# Patient Record
Sex: Male | Born: 1980 | Race: White | Hispanic: No | Marital: Married
Health system: Southern US, Community
[De-identification: ages and names within clinical notes are randomized; demographics above are authoritative.]

## PROBLEM LIST (undated history)

## (undated) DIAGNOSIS — Z7289 Other problems related to lifestyle: Secondary | ICD-10-CM

## (undated) DIAGNOSIS — F1090 Alcohol use, unspecified, uncomplicated: Secondary | ICD-10-CM

## (undated) DIAGNOSIS — F32A Depression, unspecified: Secondary | ICD-10-CM

## (undated) DIAGNOSIS — F419 Anxiety disorder, unspecified: Secondary | ICD-10-CM

## (undated) DIAGNOSIS — K219 Gastro-esophageal reflux disease without esophagitis: Secondary | ICD-10-CM

## (undated) DIAGNOSIS — L4050 Arthropathic psoriasis, unspecified: Secondary | ICD-10-CM

## (undated) HISTORY — PX: NO PAST SURGERIES: SHX2092

## (undated) HISTORY — DX: Anxiety disorder, unspecified: F41.9

## (undated) HISTORY — DX: Gastro-esophageal reflux disease without esophagitis: K21.9

## (undated) HISTORY — DX: Arthropathic psoriasis, unspecified: L40.50

## (undated) HISTORY — PX: OTHER SURGICAL HISTORY: SHX169

## (undated) HISTORY — DX: Other problems related to lifestyle: Z72.89

## (undated) HISTORY — DX: Alcohol use, unspecified, uncomplicated: F10.90

---

## 2019-03-30 ENCOUNTER — Other Ambulatory Visit: Payer: Self-pay

## 2019-03-30 ENCOUNTER — Ambulatory Visit
Admission: RE | Admit: 2019-03-30 | Discharge: 2019-03-30 | Disposition: A | Discharge status: Home / Self Care | Payer: No Typology Code available for payment source | Source: Ambulatory Visit | Attending: Internal Medicine | Admitting: Internal Medicine

## 2019-03-30 ENCOUNTER — Other Ambulatory Visit: Payer: Self-pay | Admitting: Internal Medicine

## 2019-03-30 ENCOUNTER — Telehealth: Payer: Self-pay | Admitting: *Deleted

## 2019-03-30 DIAGNOSIS — Z20822 Contact with and (suspected) exposure to covid-19: Secondary | ICD-10-CM

## 2019-03-30 DIAGNOSIS — R112 Nausea with vomiting, unspecified: Secondary | ICD-10-CM

## 2019-03-30 NOTE — Telephone Encounter (Signed)
Dr Johnella Moloney, [facility] called to get the pt scheduled for community COVID testing;  He says that the pt is high risk, and takes, humira for psoriatic arthritis; the pt is also having, n/v, and fever; the pt can be contacted at  [phone]; will attempt to contact pt.

## 2019-03-30 NOTE — Telephone Encounter (Signed)
Contacted pt to schedule testing; he is unable to come if or testing today because he is waiting to have labs drawn, and a chest xray; pt requests to be seen 03/31/2019 at 1000; pt scheduled for that time at the [facility] location; he verbalizes understanding; order placed per protocol.

## 2019-03-31 ENCOUNTER — Other Ambulatory Visit: Payer: Self-pay

## 2019-03-31 ENCOUNTER — Other Ambulatory Visit: Payer: Self-pay | Admitting: Internal Medicine

## 2019-03-31 DIAGNOSIS — R1011 Right upper quadrant pain: Secondary | ICD-10-CM

## 2019-03-31 DIAGNOSIS — Z20822 Contact with and (suspected) exposure to covid-19: Secondary | ICD-10-CM

## 2019-04-03 LAB — NOVEL CORONAVIRUS, NAA: SARS-CoV-2, NAA: NOT DETECTED

## 2019-04-05 ENCOUNTER — Ambulatory Visit
Admission: RE | Admit: 2019-04-05 | Discharge: 2019-04-05 | Disposition: A | Discharge status: Home / Self Care | Payer: No Typology Code available for payment source | Source: Ambulatory Visit | Attending: Internal Medicine | Admitting: Internal Medicine

## 2019-04-05 ENCOUNTER — Other Ambulatory Visit: Payer: Self-pay

## 2019-04-05 DIAGNOSIS — R1011 Right upper quadrant pain: Secondary | ICD-10-CM

## 2019-04-27 NOTE — Progress Notes (Signed)
New Patient Virtual Visit via Video Note The purpose of this virtual visit is to provide medical care while limiting exposure to the novel coronavirus.    Consent was obtained for video visit:  Yes.   Answered questions that patient had about telehealth interaction:  Yes.   I discussed the limitations, risks, security and privacy concerns of performing an evaluation and management service by telemedicine. I also discussed with the patient that there may be a patient responsible charge related to this service. The patient expressed understanding and agreed to proceed.  Pt location: Home Physician Location: office Name of referring provider:  Josetta Huddle, MD I connected with Buren Kos at patients initiation/request on 04/28/2019 at  9:50 AM EDT by video enabled telemedicine application and verified that I am speaking with the correct person using two identifiers. Pt MRN:  [id number] Pt DOB:  [birth year] Video Participants:  Buren Kos    History of Present Illness: Joshua Wade is a 38 y.o. right-handed male with psoriatic arthritis, tobacco use, anxiety, GERD and presenting for evaluation of bilateral leg paresthesias.   Starting in April 2020, he began having nausea, vomiting, and GI upset.  He was tested for COVID which was negative.  Soon after this resolves, he started experiencing pins-and-needles sensation of the toes and soles.  He also has similar sensation in the lower legs below the knee.  Initially, he did experience some imbalance especially with eyes closed, and fatigue.  The symptoms have improved over the past few weeks.  He continues to feel fatigued and has worsening feet paresthesias at the end of a long day of working.  He does not have any low back pain, bowel or bladder incontinence, or weakness of the legs.  He has returned to work as the [employer] at [employer] and has noticed slight burning and tingling of the tips of the fingers.  He was drinking 3 glasses  of wine nightly for the past 5 years, however stopped this prior to his GI upset.  He is on Humira for psoriatic arthritis and is followed by Leafy Kindle, PA.  His father had ALS and was my patient.  Past Medical History:  Diagnosis Date  . Acid reflux   . Alcohol use   . Anxiety   . Psoriatic arthritis [facility])     Past Surgical History:  Procedure Laterality Date  . NO PAST SURGERIES       Medications:  Outpatient Encounter Medications as of 04/28/2019  Medication Sig  . Adalimumab (HUMIRA) 10 MG/0.1ML PSKT Inject into the skin every 14 (fourteen) days.  . ALPRAZolam (XANAX) 0.5 MG tablet Take 0.5 mg by mouth at bedtime as needed for anxiety.  Marland Kitchen escitalopram (LEXAPRO) 10 MG tablet Take 10 mg by mouth daily.  Marland Kitchen LORazepam (ATIVAN) 0.5 MG tablet Take 0.5 mg by mouth 2 (two) times daily.  . NON FORMULARY daily. CBD Oil  . pantoprazole (PROTONIX) 40 MG tablet Take 40 mg by mouth daily.   No facility-administered encounter medications on file as of 04/28/2019.     Allergies:  Allergies  Allergen Reactions  . Penicillins     Family History: Family History  Problem Relation Age of Onset  . Lupus Mother   . Colon cancer Mother   . ALS Father     Social History: Social History   Tobacco Use  . Smoking status: Former Research scientist (life sciences)  . Smokeless tobacco: Never Used  Substance Use Topics  . Alcohol use: Yes  . Drug use:  Not Currently    Types: Marijuana    Comment: in the past   Social History   Social History Narrative   Right handed      Lives with wife in one story home      Owns Restaurant      Highest Level Education- [employer]    Review of Systems:  CONSTITUTIONAL: No fevers, chills, night sweats, or weight loss.   EYES: No visual changes or eye pain ENT: No hearing changes.  No history of nose bleeds.   RESPIRATORY: No cough, wheezing and shortness of breath.   CARDIOVASCULAR: Negative for chest pain, and palpitations.   GI: Negative for  abdominal discomfort, blood in stools or black stools.  No recent change in bowel habits.   GU:  No history of incontinence.   MUSCLOSKELETAL: No history of joint pain or swelling.  No myalgias.   SKIN: Negative for lesions, rash, and itching.   HEMATOLOGY/ONCOLOGY: Negative for prolonged bleeding, bruising easily, and swollen nodes.  No history of cancer.   ENDOCRINE: Negative for cold or heat intolerance, polydipsia or goiter.   PSYCH:  No depression +anxiety symptoms.   NEURO: As Above.   Vital Signs:  Ht 6\' 2"  (1.88 m)   Wt 190 lb (86.2 kg)   BMI 24.39 kg/m    General Medical Exam:  Well appearing, comfortable.  Nonlabored breathing.  No deformity or edema.  No rash.  Neurological Exam: MENTAL STATUS including orientation to time, place, person, recent and remote memory, attention span and concentration, language, and fund of knowledge is normal.  Speech is not dysarthric.  CRANIAL NERVES:  Normal conjugate, extra-ocular eye movements in all directions of gaze.  No ptosis.  Normal facial symmetry and movements.  Normal shoulder shrug and head rotation.  Tongue is midline.  MOTOR:  Antigravity in all extremities.  No abnormal movements.  No pronator drift.   SENSORY & REFLEXES: Unable to assess   COORDINATION/GAIT: Normal finger to nose bilaterally.  Intact rapid alternating movements bilaterally.  Able to rise from a chair without using arms.  Gait narrow based and stable. Tandem and stressed gait intact.    IMPRESSION/PLAN: Subacute onset of bilateral feet and leg paresthesias.  Symptoms could certainly be related to alcohol use and/or associated vitamin deficiencies.  Alternatively, with his preceding GI illness, demyelinating polyradiculoneuropathy is also considered.  I will request his recent lab test from his PCPs office, to check to see whether vitamin B-12, vitamin B1, folate, and TSH has been checked.   I will plan to bring him to the office for a formal neuromuscular  exam and determine whether electrodiagnostic testing is indicated. For paresthesias, start gabapentin 300 mg at bedtime for 1 week then increase to 300 mg twice daily.  Follow Up Instructions:  I discussed the assessment and treatment plan with the patient. The patient was provided an opportunity to ask questions and all were answered. The patient agreed with the plan and demonstrated an understanding of the instructions.   The patient was advised to call back or seek an in-person evaluation if the symptoms worsen or if the condition fails to improve as anticipated.  Return to clinic 1-2 weeks  Total Time spent:  50 min   [place] Chardonika K Patel, DO

## 2019-04-28 ENCOUNTER — Telehealth: Payer: Self-pay

## 2019-04-28 ENCOUNTER — Other Ambulatory Visit: Payer: Self-pay

## 2019-04-28 ENCOUNTER — Encounter: Payer: Self-pay | Admitting: Neurology

## 2019-04-28 ENCOUNTER — Telehealth (INDEPENDENT_AMBULATORY_CARE_PROVIDER_SITE_OTHER): Payer: No Typology Code available for payment source | Admitting: Neurology

## 2019-04-28 VITALS — Ht 74.0 in | Wt 190.0 lb

## 2019-04-28 DIAGNOSIS — R202 Paresthesia of skin: Secondary | ICD-10-CM

## 2019-04-28 MED ORDER — GABAPENTIN 300 MG PO CAPS: ORAL_CAPSULE | ORAL | 3 refills | Status: DC

## 2019-04-28 NOTE — Telephone Encounter (Signed)
Requested records from [place]. Dr. Josetta Huddle from the last 2 months.

## 2019-05-01 ENCOUNTER — Other Ambulatory Visit: Payer: Self-pay

## 2019-05-01 DIAGNOSIS — R202 Paresthesia of skin: Secondary | ICD-10-CM

## 2019-05-01 NOTE — Telephone Encounter (Signed)
PCP notes and labs received dated 04/13/2019.  Labs indicate that he was consuming 5 to 6 glasses of wine a day and found to be very tremulous on exam.  Labs indicated transaminitis with AST and ALT in the 100-200 range.  He was started on Lorazepam for possible alcohol withdrawal, which had helped.  Labs 03/30/2019: AST 156, ALT 247, total bilirubin 1.4, lipase 174, vitamin B12 403, TSH 1.87  Labs 04/13/2019: AST 22, ALT 24, lipase 82, total bilirubin 0.3  I will plan to check a few additional labs and bring him to the office this week for formal neuromuscular exam and EMG, if needed.  Donika K. Posey Pronto, DO

## 2019-05-01 NOTE — Telephone Encounter (Signed)
Patient is coming in for a follow up at 2:30 on 05-04-19 and a EMG on 05-04-19 @ 3:00. Patient was also informed that he would need more blood work.   I need to know which body part we are doing the EMG on and Caryl Pina will you please put the order in so I can link it a

## 2019-05-01 NOTE — Telephone Encounter (Signed)
EMG order placed. Bilateral lower extremities

## 2019-05-04 ENCOUNTER — Other Ambulatory Visit: Payer: Self-pay

## 2019-05-04 ENCOUNTER — Ambulatory Visit (INDEPENDENT_AMBULATORY_CARE_PROVIDER_SITE_OTHER): Payer: No Typology Code available for payment source | Admitting: Neurology

## 2019-05-04 ENCOUNTER — Encounter: Payer: Self-pay | Admitting: Neurology

## 2019-05-04 VITALS — BP 122/88 | HR 112 | Ht 74.5 in | Wt 192.0 lb

## 2019-05-04 DIAGNOSIS — R202 Paresthesia of skin: Secondary | ICD-10-CM | POA: Diagnosis not present

## 2019-05-04 DIAGNOSIS — G629 Polyneuropathy, unspecified: Secondary | ICD-10-CM

## 2019-05-04 NOTE — Procedures (Signed)
[facility]  [place], [place]  [place], [place] [address] Tel: [phone] Fax:  [phone] Test Date:  05/04/2019  Patient: Joshua Wade DOB: 1981-10-12 Physician: Narda Amber, DO  Sex: Male Height: 6\' 2"  Ref Phys: Narda Amber, DO  ID#: [id number] Temp: 33.0C Technician:    Patient Complaints: This is a 38 year old man referred for evaluation of subacute onset of bilateral feet paresthesias and pain.  NCV & EMG Findings: Extensive electrodiagnostic testing of the right lower extremity and additional studies of the left shows:  1. Bilateral sural sensory responses show mildly reduced amplitude (R4.8, L4.8 V).  Bilateral superficial peroneal sensory responses are borderline normal. 2. Right peroneal motor response is reduced at the extensor digitorum brevis, and normal at the tibialis anterior.  Left peroneal and bilateral tibial motor responses are within normal limits. 3. Bilateral tibial H reflex studies show prolonged latencies. 4. There is no evidence of active or chronic motor axonal loss changes affecting any of the tested muscles.  Motor unit configuration and recruitment pattern is within normal limits.  There is no evidence of active denervation in the lumbar paraspinal muscles.  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  Overall, these findings are mild in degree electrically.   ___________________________ Narda Amber, DO    Nerve Conduction Studies Anti Sensory Summary Table   Site NR Peak (ms) Norm Peak (ms) P-T Amp (V) Norm P-T Amp  Left Sup Peroneal Anti Sensory (Ant Lat Mall)  33C  12 cm    3.2 <4.5 5.5 >5  Right Sup Peroneal Anti Sensory (Ant Lat Mall)  33C  12 cm    2.8 <4.5 5.1 >5  Left Sural Anti Sensory (Lat Mall)  33C  Calf    4.5 <4.5 4.8 >5  Right Sural Anti Sensory (Lat Mall)  33C  Calf    3.5 <4.5 4.8  >5   Motor Summary Table   Site NR Onset (ms) Norm Onset (ms) O-P Amp (mV) Norm O-P Amp Site1 Site2 Delta-0 (ms) Dist (cm) Vel (m/s) Norm Vel (m/s)  Left Peroneal Motor (Ext Dig Brev)  33C  Ankle    4.6 <5.5 4.0 >3 B Fib Ankle 9.3 40.0 43 >40  B Fib    13.9  3.4  Poplt B Fib 2.2 9.0 41 >40  Poplt    16.1  3.4         Right Peroneal Motor (Ext Dig Brev)  33C  Ankle    4.8 <5.5 2.6 >3 B Fib Ankle 10.1 40.0 40 >40  B Fib    14.9  2.3  Poplt B Fib 2.3 10.0 43 >40  Poplt    17.2  2.2         Left Peroneal TA Motor (Tib Ant)  33C  Fib Head    3.9 <4.0 4.0 >4 Poplit Fib Head 2.2 9.0 41 >40  Poplit    6.1  4.0         Right Peroneal TA Motor (Tib Ant)  33C  Fib Head    3.1 <4.0 4.5 >4 Poplit Fib Head 2.1 9.0 43 >40  Poplit    5.2  4.5         Left Tibial Motor (Abd Hall Brev)  33C  Ankle    4.5 <6.0 11.8 >8 Knee Ankle 11.3 45.0 40 >40  Knee    15.8  9.8  Right Tibial Motor (Abd Hall Brev)  33C  Ankle    5.7 <6.0 11.8 >8 Knee Ankle 10.6 45.0 42 >40  Knee    16.3  9.1          H Reflex Studies   NR H-Lat (ms) Lat Norm (ms) L-R H-Lat (ms)  Left Tibial (Gastroc)  33C     40.41 <35 0.41  Right Tibial (Gastroc)  33C     40.00 <35 0.41   EMG   Side Muscle Ins Act Fibs Psw Fasc Number Recrt Dur Dur. Amp Amp. Poly Poly. Comment  Left Flex Dig Long Nml Nml Nml Nml Nml Nml Nml Nml Nml Nml Nml Nml N/A  Left AntTibialis Nml Nml Nml Nml Nml Nml Nml Nml Nml Nml Nml Nml N/A  Left Gastroc Nml Nml Nml Nml Nml Nml Nml Nml Nml Nml Nml Nml N/A  Left BicepsFemS Nml Nml Nml Nml Nml Nml Nml Nml Nml Nml Nml Nml N/A  Left GluteusMed Nml Nml Nml Nml Nml Nml Nml Nml Nml Nml Nml Nml N/A  Left RectFemoris Nml Nml Nml Nml Nml Nml Nml Nml Nml Nml Nml Nml N/A  Right AntTibialis Nml Nml Nml Nml Nml Nml Nml Nml Nml Nml Nml Nml N/A  Right GluteusMed Nml Nml Nml Nml Nml Nml Nml Nml Nml Nml Nml Nml N/A  Right Flex Dig Long Nml Nml Nml Nml Nml Nml Nml Nml Nml Nml Nml Nml N/A  Right RectFemoris Nml Nml  Nml Nml Nml Nml Nml Nml Nml Nml Nml Nml N/A  Right Gastroc Nml Nml Nml Nml Nml Nml Nml Nml Nml Nml Nml Nml N/A  Right BicepsFemS Nml Nml Nml Nml Nml Nml Nml Nml Nml Nml Nml Nml N/A  Right Lumbo Parasp Low Nml Nml Nml Nml NE - - - - - - - N/A      Waveforms:

## 2019-05-05 DIAGNOSIS — G629 Polyneuropathy, unspecified: Secondary | ICD-10-CM | POA: Insufficient documentation

## 2019-05-05 NOTE — Progress Notes (Signed)
Follow-up Visit   Date: 05/04/2019   Joshua Wade MRN: [id number] DOB: 12-Aug-1981   Interim History: Joshua Wade is a 38 y.o. right-handed Caucasian male with returning to the clinic for follow-up of bilateral feet paresthesias.  The patient was accompanied to the clinic by self.  History of present illness: Starting in April 2020, he began having nausea, vomiting, and GI upset.  He was tested for COVID which was negative.  Soon after this resolves, he started experiencing pins-and-needles sensation of the toes and soles.  He also has similar sensation in the lower legs below the knee.  Initially, he did experience some imbalance especially with eyes closed, and fatigue.  The symptoms have improved over the past few weeks.  He continues to feel fatigued and has worsening feet paresthesias at the end of a long day of working.  He does not have any low back pain, bowel or bladder incontinence, or weakness of the legs.  He has returned to work as the [employer] at [employer] and has noticed slight burning and tingling of the tips of the fingers.  He was drinking 3 glasses of wine nightly for the past 5 years, however stopped this prior to his GI upset.  UPDATE 05/04/2019: He is here for follow-up visit and electrodiagnostic testing of the legs.  At his last visit, he was started on gabapentin 300 mg at bedtime, which has helped reduce the burning and tingling sensation of his feet.  He continues to have low-grade tingling and pain in the feet at all times, especially after a long day at work.  His overall strength and balance has significantly improved.  He has been abstaining from alcohol.  He no longer has numbness or tingling of the fingertips.  Medications:  Current Outpatient Medications on File Prior to Visit  Medication Sig Dispense Refill  . Adalimumab (HUMIRA) 10 MG/0.1ML PSKT Inject into the skin every 14 (fourteen) days.    . ALPRAZolam (XANAX) 0.5 MG tablet Take 0.5 mg by  mouth at bedtime as needed for anxiety.    Marland Kitchen escitalopram (LEXAPRO) 10 MG tablet Take 10 mg by mouth daily.    Marland Kitchen gabapentin (NEURONTIN) 300 MG capsule Take 1 tablet at bedtime for one week, then increase to 1 tablet twice daily. 60 capsule 3  . LORazepam (ATIVAN) 0.5 MG tablet Take 0.5 mg by mouth 2 (two) times daily.    . NON FORMULARY daily. CBD Oil    . pantoprazole (PROTONIX) 40 MG tablet Take 40 mg by mouth 3 times/day as needed-between meals & bedtime.      No current facility-administered medications on file prior to visit.     Allergies:  Allergies  Allergen Reactions  . Penicillins     Review of Systems:  CONSTITUTIONAL: No fevers, chills, night sweats, or weight loss.  EYES: No visual changes or eye pain ENT: No hearing changes.  No history of nose bleeds.   RESPIRATORY: No cough, wheezing and shortness of breath.   CARDIOVASCULAR: Negative for chest pain, and palpitations.   GI: Negative for abdominal discomfort, blood in stools or black stools.  No recent change in bowel habits.   GU:  No history of incontinence.   MUSCLOSKELETAL: No history of joint pain or swelling.  No myalgias.   SKIN: Negative for lesions, rash, and itching.   ENDOCRINE: Negative for cold or heat intolerance, polydipsia or goiter.   PSYCH:  No depression or anxiety symptoms.   NEURO: As Above.  Vital Signs:  BP 122/88   Pulse (!) 112   Ht 6' 2.5" (1.892 m)   Wt 192 lb (87.1 kg)   SpO2 98%   BMI 24.32 kg/m    General Medical Exam:   General:  Well appearing, comfortable  Eyes/ENT: see cranial nerve examination.   Neck:  No carotid bruits. Respiratory:  Clear to auscultation, good air entry bilaterally.   Cardiac:  Regular rate and rhythm, no murmur.   Ext:  No edema   Neurological Exam: MENTAL STATUS including orientation to time, place, person, recent and remote memory, attention span and concentration, language, and fund of knowledge is normal.  Speech is not dysarthric.  CRANIAL  NERVES:  No visual field defects.  Pupils equal round and reactive to light.  Normal conjugate, extra-ocular eye movements in all directions of gaze.  No ptosis.  Face is symmetric.   MOTOR:  Motor strength is 5/5 in all extremities, trace weakness with toe extension bilaterally.  No atrophy, fasciculations or abnormal movements.  No pronator drift.  Tone is normal.    MSRs:  Reflexes are 2+/4 throughout, except trace bilateral Achilles.  Plantars are downgoing.  SENSORY:  Reduced vibration at the ankles, hyperesthesia to pin prick and temperature below the ankles and slightly into the lower leg.  Proprioception is intact.   COORDINATION/GAIT:  Normal finger-to- nose-finger.  Intact rapid alternating movements bilaterally.  Gait narrow based and stable.  Stressed and tandem gait intact.   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  Overall, these findings are mild in degree electrically.  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  His neurological exam also is consistent with a distal neuropathy, as noted by reduced reflexes, trace weakness in the toes, and sensory deficits in the feet.  No findings on EMG to support CIDP, however the subacute onset of his neuropathy still raises this possibility and if there is any worsening or lack of improvement going forward, I have low threshold to obtain CSF analysis.   I will check a few additional labs for this treatable causes of neuropathy including ESR, CRP, MMA, vitamin B1, folate, SPEP with IFE, copper.   For pain, increase gabapentin to 310m twice daily - this can be further titrated, as  needed  If this is all alcohol-related, symptoms should improve with time, especially as he continues to minimize alcohol consumption.  Further recommendations pending results.   Thank you for allowing me to participate in patient's care.  If I can answer any additional questions, I would be pleased to do so.    Sincerely,    Lynard Postlewait K. PPosey Pronto DO

## 2019-05-08 ENCOUNTER — Other Ambulatory Visit: Payer: Self-pay | Admitting: Neurology

## 2019-05-12 LAB — IMMUNOFIXATION ELECTROPHORESIS
IgG (Immunoglobin G), Serum: 847 mg/dL (ref 600–1640)
IgM, Serum: 60 mg/dL (ref 50–300)
Immunofix Electr Int: NOT DETECTED
Immunoglobulin A: 203 mg/dL (ref 47–310)

## 2019-05-12 LAB — PROTEIN ELECTROPHORESIS, SERUM
Albumin ELP: 4.4 g/dL (ref 3.8–4.8)
Alpha 1: 0.3 g/dL (ref 0.2–0.3)
Alpha 2: 0.7 g/dL (ref 0.5–0.9)
Beta 2: 0.3 g/dL (ref 0.2–0.5)
Beta Globulin: 0.4 g/dL (ref 0.4–0.6)
Gamma Globulin: 0.8 g/dL (ref 0.8–1.7)
Total Protein: 7 g/dL (ref 6.1–8.1)

## 2019-05-12 LAB — VITAMIN B1: Vitamin B1 (Thiamine): 10 nmol/L (ref 8–30)

## 2019-05-12 LAB — C-REACTIVE PROTEIN: CRP: 0.2 mg/L (ref <8.0)

## 2019-05-12 LAB — SEDIMENTATION RATE: Sed Rate: 2 mm/h (ref 0–15)

## 2019-05-12 LAB — FOLATE: Folate: 17.2 ng/mL

## 2019-05-12 LAB — METHYLMALONIC ACID, SERUM: Methylmalonic Acid, Quant: 108 nmol/L (ref 87–318)

## 2019-05-12 LAB — COPPER, SERUM: Copper: 98 ug/dL (ref 70–175)

## 2019-06-30 ENCOUNTER — Other Ambulatory Visit: Payer: Self-pay | Admitting: Neurology

## 2019-06-30 NOTE — Telephone Encounter (Signed)
Requested Prescriptions   Pending Prescriptions Disp Refills  . gabapentin (NEURONTIN) 300 MG capsule [Pharmacy Med Name: GABAPENTIN 300MG  CAPSULES] 60 capsule 3    Sig: TAKE 1 CAPSULE BY MOUTH AT BEDTIME FOR 1 WEEK. THEN INCREASE TO 1 CAPSULE 2 TIMES DAILY   Rx last filled: 04/28/19 #60 3 refills  Pt last seen:05/04/19  For pain, increase gabapentin to 300mg  twice daily - this can be further titrated, as needed  Follow up appt scheduled:07/12/19

## 2019-07-06 ENCOUNTER — Encounter: Payer: Self-pay | Admitting: Neurology

## 2019-07-12 ENCOUNTER — Encounter: Payer: Self-pay | Admitting: Neurology

## 2019-07-12 ENCOUNTER — Other Ambulatory Visit: Payer: Self-pay

## 2019-07-12 ENCOUNTER — Ambulatory Visit (INDEPENDENT_AMBULATORY_CARE_PROVIDER_SITE_OTHER): Payer: No Typology Code available for payment source | Admitting: Neurology

## 2019-07-12 VITALS — BP 110/80 | HR 107 | Ht 74.0 in | Wt 198.0 lb

## 2019-07-12 DIAGNOSIS — G629 Polyneuropathy, unspecified: Secondary | ICD-10-CM | POA: Diagnosis not present

## 2019-07-12 MED ORDER — GABAPENTIN 300 MG PO CAPS: 300.0000 mg | ORAL_CAPSULE | Freq: Two times a day (BID) | ORAL | 3 refills | Status: DC

## 2019-07-12 NOTE — Progress Notes (Signed)
Follow-up Visit   Date: 05/04/2019   Joshua Wade MRN: [id number] DOB: 1980/11/28   Interim History: Joshua Wade is a 38 y.o. right-handed Caucasian male with returning to the clinic for follow-up of bilateral feet paresthesias.  The patient was accompanied to the clinic by self.  History of present illness: Starting in April 2020, he began having nausea, vomiting, and GI upset.  He was tested for COVID which was negative.  Soon after this resolves, he started experiencing pins-and-needles sensation of the toes and soles.  He also has similar sensation in the lower legs below the knee.  Initially, he did experience some imbalance especially with eyes closed, and fatigue.  The symptoms have improved over the past few weeks.  He continues to feel fatigued and has worsening feet paresthesias at the end of a long day of working.  He does not have any low back pain, bowel or bladder incontinence, or weakness of the legs.  He has returned to work as the [employer] at [employer] and has noticed slight burning and tingling of the tips of the fingers.  He was drinking 3 glasses of wine nightly for the past 5 years, however stopped this prior to his GI upset.  UPDATE 05/04/2019: He is here for follow-up visit and electrodiagnostic testing of the legs.  At his last visit, he was started on gabapentin 300 mg at bedtime, which has helped reduce the burning and tingling sensation of his feet.  He continues to have low-grade tingling and pain in the feet at all times, especially after a long day at work.  His overall strength and balance has significantly improved.  He has been abstaining from alcohol.  He no longer has numbness or tingling of the fingertips.  UPDATE 07/12/2019:  He is here for follow-up visit and reports that he has less pain in the legs, which is now only apparent at the end of the day and with toe extension, such as when doing a push-up.  Pain no longer prevents him from falling  asleep.  He continues to have constant numbness in the feet, however this is no worse than before.  Overall, he feels his symptoms are slowly starting to improve.  His balance is better.  His endurance is still not where he would like for it to be and he tends to tire easily.  He does admit to drinking a couple of glasses of wine nightly, however not as much as he previously was.   Medications:  Current Outpatient Medications on File Prior to Visit  Medication Sig Dispense Refill  . Adalimumab (HUMIRA) 10 MG/0.1ML PSKT Inject into the skin every 14 (fourteen) days.    . ALPRAZolam (XANAX) 0.5 MG tablet Take 0.5 mg by mouth at bedtime as needed for anxiety.    Marland Kitchen escitalopram (LEXAPRO) 10 MG tablet Take 10 mg by mouth daily.    Marland Kitchen LORazepam (ATIVAN) 0.5 MG tablet Take 0.5 mg by mouth 2 (two) times daily.    . NON FORMULARY daily. CBD Oil    . pantoprazole (PROTONIX) 40 MG tablet Take 40 mg by mouth 3 times/day as needed-between meals & bedtime.      No current facility-administered medications on file prior to visit.     Allergies:  Allergies  Allergen Reactions  . Penicillins     Review of Systems:  CONSTITUTIONAL: No fevers, chills, night sweats, or weight loss.  EYES: No visual changes or eye pain ENT: No hearing changes.  No history  of nose bleeds.   RESPIRATORY: No cough, wheezing and shortness of breath.   CARDIOVASCULAR: Negative for chest pain, and palpitations.   GI: Negative for abdominal discomfort, blood in stools or black stools.  No recent change in bowel habits.   GU:  No history of incontinence.   MUSCLOSKELETAL: No history of joint pain or swelling.  No myalgias.   SKIN: Negative for lesions, rash, and itching.   ENDOCRINE: Negative for cold or heat intolerance, polydipsia or goiter.   PSYCH:  No depression or anxiety symptoms.   NEURO: As Above.   Vital Signs:  BP 110/80   Pulse (!) 107   Ht 6' 2"  (1.88 m)   Wt 198 lb (89.8 kg)   SpO2 98%   BMI 25.42 kg/m    Neurological Exam: MENTAL STATUS including orientation to time, place, person, recent and remote memory, attention span and concentration, language, and fund of knowledge is normal.  Speech is not dysarthric.  CRANIAL NERVES:    Face is symmetric.   MOTOR:  Motor strength is 5/5 in all extremities, including distally with toe flexion and extension  (improved) hello no atrophy, fasciculations or abnormal movements.  No pronator drift.  Tone is normal.    MSRs:                                           Right        Left brachioradialis 2+  2+  biceps 2+  2+  triceps 2+  2+  patellar 2+  2+  ankle jerk 1+  1+  Hoffman no  no  plantar response down  down    SENSORY:  Reduced vibration at the ankles, hyperesthesia to pin prick and temperature below the ankles; sensation in the lower leg is intact.  Proprioception is intact.  Rhomberg testing is negative.  COORDINATION/GAIT:  Normal finger-to- nose-finger.  Intact rapid alternating movements bilaterally.  Gait narrow based and stable.  Stressed and tandem gait intact.   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  Overall, these findings are mild in degree electrically.  Labs 03/30/2019: AST 156, ALT 247, total bilirubin 1.4, lipase 174, vitamin B12 403, TSH 1.87  Labs 04/13/2019: AST 22, ALT 24, lipase 82, total bilirubin 0.3  Labs 05/08/2019:  ESR 2, CRP 0.2, MMA 108, vitamin B1 10, folate 17.2, SPEP with IFE no M protein, copper 98.    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  This is supported by his EDX findings and exam.  Fortunately, he is seeing slow improvement.  I have encouraged him to limit alcohol to no more than one glass of wine daily, if not less. Continue gabapentin 367m twice daily which adequately controls  pain  Return to clinic in 6 months  Thank you for allowing me to participate in patient's care.  If I can answer any additional questions, I would be pleased to do so.    Sincerely,    Jeryn Cerney K. PPosey Pronto DO

## 2019-07-12 NOTE — Patient Instructions (Addendum)
It was great to see you today!  Continue gabapentin 300mg  twice daily  Try to limit alcohol   Return to clinic in 6 months

## 2020-01-09 ENCOUNTER — Encounter: Payer: Self-pay | Admitting: Neurology

## 2020-01-12 ENCOUNTER — Telehealth (INDEPENDENT_AMBULATORY_CARE_PROVIDER_SITE_OTHER): Payer: No Typology Code available for payment source | Admitting: Neurology

## 2020-01-12 ENCOUNTER — Other Ambulatory Visit: Payer: Self-pay

## 2020-01-12 DIAGNOSIS — G629 Polyneuropathy, unspecified: Secondary | ICD-10-CM

## 2020-01-12 NOTE — Progress Notes (Signed)
   Virtual Visit via Video Note The purpose of this virtual visit is to provide medical care while limiting exposure to the novel coronavirus.    Consent was obtained for video visit:  Yes.   Answered questions that patient had about telehealth interaction:  Yes.   I discussed the limitations, risks, security and privacy concerns of performing an evaluation and management service by telemedicine. I also discussed with the patient that there may be a patient responsible charge related to this service. The patient expressed understanding and agreed to proceed.  Pt location: Home Physician Location: office Name of referring provider:  Marden Noble, MD I connected with Joshua Wade at patients initiation/request on 01/12/2020 at 10:30 AM EST by video enabled telemedicine application and verified that I am speaking with the correct person using two identifiers. Pt MRN:  [id number] Pt DOB:  01-25-1981 Video Participants:  Joshua Wade   History of Present Illness: This is a 39 y.o. male returning for follow-up of peripheral neuropathy.  He has noticed that the numbness no longer involves his ankles or upper legs.  He has tingling burning over the tops of the feet which is well-controlled on gabapentin 300-600mg /d.  He was taking gabapentin 300mg  twice daily, but tends to only take it in the morning before work and often skips the evening dose.  Despite this, he has not noticed worsening paresthesias. He denies any new weakness, imbalance, or falls.  He is trying to be more active and walking 3-4 miles daily and stays busy at work.  He is drinking 2 glasses of wine nightly.    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  Overall, symptoms are slowly improving and he has less numbness and is able to taper gabapentin to 300mg  daily without worsening symptoms. He may continue to take gabapentin 300mg  daily + extra dose as needed for severe  pain Limit alcohol to 2 glasses nightly Continue multivitamin  Return to clinic in 8 months  Follow Up Instructions:   I discussed the assessment and treatment plan with the patient. The patient was provided an opportunity to ask questions and all were answered. The patient agreed with the plan and demonstrated an understanding of the instructions.   The patient was advised to call back or seek an in-person evaluation if the symptoms worsen or if the condition fails to improve as anticipated.   , DO

## 2020-01-22 ENCOUNTER — Ambulatory Visit: Payer: Self-pay | Attending: Internal Medicine

## 2020-01-22 DIAGNOSIS — Z23 Encounter for immunization: Secondary | ICD-10-CM | POA: Insufficient documentation

## 2020-01-22 NOTE — Progress Notes (Signed)
   Covid-19 Vaccination Clinic  Name:  Santhosh Gulino    MRN: [id number] DOB: October 15, 1981  01/22/2020  Mr. Mollenkopf was observed post Covid-19 immunization for 15 minutes without incident. He was provided with Vaccine Information Sheet and instruction to access the V-Safe system.   Mr. Spradley was instructed to call 911 with any severe reactions post vaccine: Marland Kitchen Difficulty breathing  . Swelling of face and throat  . A fast heartbeat  . A bad rash all over body  . Dizziness and weakness   Immunizations Administered    Name Date Dose VIS Date Route   Pfizer COVID-19 Vaccine 01/22/2020  4:58 PM 0.3 mL 10/27/2019 Intramuscular   Manufacturer: [employer], [employer]   Lot: [id number]   NDC: [address]-1

## 2020-02-12 ENCOUNTER — Ambulatory Visit: Payer: Self-pay | Attending: Internal Medicine

## 2020-02-12 DIAGNOSIS — Z23 Encounter for immunization: Secondary | ICD-10-CM

## 2020-02-12 NOTE — Progress Notes (Signed)
   Covid-19 Vaccination Clinic  Name:  Joshua Wade    MRN: [id number] DOB: 1981/09/30  02/12/2020  Joshua Wade was observed post Covid-19 immunization for 15 minutes without incident. He was provided with Vaccine Information Sheet and instruction to access the V-Safe system.   Joshua Wade was instructed to call 911 with any severe reactions post vaccine: Marland Kitchen Difficulty breathing  . Swelling of face and throat  . A fast heartbeat  . A bad rash all over body  . Dizziness and weakness   Immunizations Administered    Name Date Dose VIS Date Route   Pfizer COVID-19 Vaccine 02/12/2020 11:20 AM 0.3 mL 10/27/2019 Intramuscular   Manufacturer: [employer], [employer]   Lot: [id number]   NDC: [address]-8

## 2020-03-18 ENCOUNTER — Other Ambulatory Visit: Payer: Self-pay

## 2020-03-18 MED ORDER — GABAPENTIN 300 MG PO CAPS: 300.0000 mg | ORAL_CAPSULE | Freq: Two times a day (BID) | ORAL | 3 refills | Status: DC

## 2020-05-24 ENCOUNTER — Emergency Department (HOSPITAL_COMMUNITY): Payer: Commercial Managed Care - PPO

## 2020-05-24 ENCOUNTER — Encounter (HOSPITAL_COMMUNITY): Payer: Self-pay

## 2020-05-24 ENCOUNTER — Observation Stay (HOSPITAL_BASED_OUTPATIENT_CLINIC_OR_DEPARTMENT_OTHER)
Admission: EM | Admit: 2020-05-24 | Discharge: 2020-05-26 | Disposition: A | Discharge status: Home / Self Care | Payer: Commercial Managed Care - PPO | Source: Home / Self Care | Attending: Emergency Medicine | Admitting: Emergency Medicine

## 2020-05-24 DIAGNOSIS — Z5321 Procedure and treatment not carried out due to patient leaving prior to being seen by health care provider: Secondary | ICD-10-CM | POA: Insufficient documentation

## 2020-05-24 DIAGNOSIS — T1490XA Injury, unspecified, initial encounter: Secondary | ICD-10-CM

## 2020-05-24 DIAGNOSIS — S0993XA Unspecified injury of face, initial encounter: Secondary | ICD-10-CM

## 2020-05-24 DIAGNOSIS — D696 Thrombocytopenia, unspecified: Secondary | ICD-10-CM

## 2020-05-24 DIAGNOSIS — R569 Unspecified convulsions: Secondary | ICD-10-CM

## 2020-05-24 DIAGNOSIS — W19XXXA Unspecified fall, initial encounter: Secondary | ICD-10-CM

## 2020-05-24 DIAGNOSIS — R7989 Other specified abnormal findings of blood chemistry: Secondary | ICD-10-CM

## 2020-05-24 DIAGNOSIS — Y9301 Activity, walking, marching and hiking: Secondary | ICD-10-CM | POA: Insufficient documentation

## 2020-05-24 DIAGNOSIS — F10239 Alcohol dependence with withdrawal, unspecified: Secondary | ICD-10-CM | POA: Diagnosis not present

## 2020-05-24 DIAGNOSIS — K759 Inflammatory liver disease, unspecified: Secondary | ICD-10-CM

## 2020-05-24 DIAGNOSIS — W010XXA Fall on same level from slipping, tripping and stumbling without subsequent striking against object, initial encounter: Secondary | ICD-10-CM | POA: Insufficient documentation

## 2020-05-24 DIAGNOSIS — Z20822 Contact with and (suspected) exposure to covid-19: Secondary | ICD-10-CM | POA: Insufficient documentation

## 2020-05-24 DIAGNOSIS — F1010 Alcohol abuse, uncomplicated: Secondary | ICD-10-CM | POA: Diagnosis present

## 2020-05-24 DIAGNOSIS — F410 Panic disorder [episodic paroxysmal anxiety] without agoraphobia: Secondary | ICD-10-CM | POA: Insufficient documentation

## 2020-05-24 DIAGNOSIS — Z79899 Other long term (current) drug therapy: Secondary | ICD-10-CM | POA: Insufficient documentation

## 2020-05-24 DIAGNOSIS — S01512A Laceration without foreign body of oral cavity, initial encounter: Secondary | ICD-10-CM | POA: Insufficient documentation

## 2020-05-24 DIAGNOSIS — S0990XA Unspecified injury of head, initial encounter: Secondary | ICD-10-CM

## 2020-05-24 DIAGNOSIS — Y990 Civilian activity done for income or pay: Secondary | ICD-10-CM | POA: Insufficient documentation

## 2020-05-24 DIAGNOSIS — F10230 Alcohol dependence with withdrawal, uncomplicated: Secondary | ICD-10-CM | POA: Insufficient documentation

## 2020-05-24 DIAGNOSIS — F10231 Alcohol dependence with withdrawal delirium: Secondary | ICD-10-CM | POA: Diagnosis not present

## 2020-05-24 DIAGNOSIS — Y92511 Restaurant or cafe as the place of occurrence of the external cause: Secondary | ICD-10-CM | POA: Insufficient documentation

## 2020-05-24 HISTORY — DX: Depression, unspecified: F32.A

## 2020-05-24 LAB — CBC
HCT: 43.4 % (ref 39.0–52.0)
Hemoglobin: 13.9 g/dL (ref 13.0–17.0)
MCH: 31.4 pg (ref 26.0–34.0)
MCHC: 32 g/dL (ref 30.0–36.0)
MCV: 98.2 fL (ref 80.0–100.0)
Platelets: 60 10*3/uL — ABNORMAL LOW (ref 150–400)
RBC: 4.42 MIL/uL (ref 4.22–5.81)
RDW: 13.9 % (ref 11.5–15.5)
WBC: 6 10*3/uL (ref 4.0–10.5)
nRBC: 0 % (ref 0.0–0.2)

## 2020-05-24 LAB — URINALYSIS, ROUTINE W REFLEX MICROSCOPIC
Bacteria, UA: NONE SEEN
Bilirubin Urine: NEGATIVE
Glucose, UA: NEGATIVE mg/dL
Ketones, ur: 20 mg/dL — AB
Leukocytes,Ua: NEGATIVE
Nitrite: NEGATIVE
Protein, ur: >=300 mg/dL — AB
Specific Gravity, Urine: 1.019 (ref 1.005–1.030)
pH: 6 (ref 5.0–8.0)

## 2020-05-24 LAB — COMPREHENSIVE METABOLIC PANEL
ALT: 145 U/L — ABNORMAL HIGH (ref 0–44)
AST: 211 U/L — ABNORMAL HIGH (ref 15–41)
Albumin: 4.4 g/dL (ref 3.5–5.0)
Alkaline Phosphatase: 65 U/L (ref 38–126)
Anion gap: 29 — ABNORMAL HIGH (ref 5–15)
BUN: 8 mg/dL (ref 6–20)
CO2: 11 mmol/L — ABNORMAL LOW (ref 22–32)
Calcium: 9.3 mg/dL (ref 8.9–10.3)
Chloride: 95 mmol/L — ABNORMAL LOW (ref 98–111)
Creatinine, Ser: 0.98 mg/dL (ref 0.61–1.24)
GFR calc Af Amer: >60 mL/min (ref >60)
GFR calc non Af Amer: >60 mL/min (ref >60)
Glucose, Bld: 159 mg/dL — ABNORMAL HIGH (ref 70–99)
Potassium: 4.1 mmol/L (ref 3.5–5.1)
Sodium: 135 mmol/L (ref 135–145)
Total Bilirubin: 0.9 mg/dL (ref 0.3–1.2)
Total Protein: 7.2 g/dL (ref 6.5–8.1)

## 2020-05-24 LAB — ETHANOL: Alcohol, Ethyl (B): 15 mg/dL — ABNORMAL HIGH (ref <10)

## 2020-05-24 LAB — SAMPLE TO BLOOD BANK

## 2020-05-24 LAB — I-STAT CHEM 8, ED
BUN: 8 mg/dL (ref 6–20)
Calcium, Ion: 1.01 mmol/L — ABNORMAL LOW (ref 1.15–1.40)
Chloride: 98 mmol/L (ref 98–111)
Creatinine, Ser: 0.7 mg/dL (ref 0.61–1.24)
Glucose, Bld: 155 mg/dL — ABNORMAL HIGH (ref 70–99)
HCT: 45 % (ref 39.0–52.0)
Hemoglobin: 15.3 g/dL (ref 13.0–17.0)
Potassium: 4 mmol/L (ref 3.5–5.1)
Sodium: 134 mmol/L — ABNORMAL LOW (ref 135–145)
TCO2: 13 mmol/L — ABNORMAL LOW (ref 22–32)

## 2020-05-24 LAB — RAPID URINE DRUG SCREEN, HOSP PERFORMED
Amphetamines: NOT DETECTED
Barbiturates: NOT DETECTED
Benzodiazepines: POSITIVE — AB
Cocaine: NOT DETECTED
Opiates: NOT DETECTED
Tetrahydrocannabinol: NOT DETECTED

## 2020-05-24 LAB — PROTIME-INR
INR: 1.1 (ref 0.8–1.2)
Prothrombin Time: 13.8 seconds (ref 11.4–15.2)

## 2020-05-24 LAB — LACTIC ACID, PLASMA: Lactic Acid, Venous: >11 mmol/L (ref 0.5–1.9)

## 2020-05-24 MED ORDER — LACTATED RINGERS IV BOLUS
1000.0000 mL | Freq: Once | INTRAVENOUS | Status: AC
  Administered 2020-05-25: 1000 mL via INTRAVENOUS

## 2020-05-24 MED ORDER — DEXAMETHASONE SODIUM PHOSPHATE 10 MG/ML IJ SOLN
10.0000 mg | Freq: Once | INTRAMUSCULAR | Status: AC
  Administered 2020-05-25: 10 mg via INTRAVENOUS
  Filled 2020-05-24: qty 1

## 2020-05-24 MED ORDER — LORAZEPAM 2 MG/ML IJ SOLN: 0.0000 mg | Freq: Two times a day (BID) | INTRAMUSCULAR | Status: DC

## 2020-05-24 MED ORDER — THIAMINE HCL 100 MG PO TABS: 100.0000 mg | ORAL_TABLET | Freq: Every day | ORAL | Status: DC

## 2020-05-24 MED ORDER — FOLIC ACID 1 MG PO TABS: 1.0000 mg | ORAL_TABLET | Freq: Every day | ORAL | Status: DC

## 2020-05-24 MED ORDER — THIAMINE HCL 100 MG/ML IJ SOLN: 100.0000 mg | Freq: Every day | INTRAMUSCULAR | Status: DC

## 2020-05-24 MED ORDER — LORAZEPAM 2 MG/ML IJ SOLN
1.0000 mg | Freq: Once | INTRAMUSCULAR | Status: AC
  Administered 2020-05-24: 1 mg via INTRAVENOUS
  Filled 2020-05-24: qty 1

## 2020-05-24 MED ORDER — FAMOTIDINE IN NACL 20-0.9 MG/50ML-% IV SOLN
20.0000 mg | Freq: Once | INTRAVENOUS | Status: AC
  Administered 2020-05-25: 20 mg via INTRAVENOUS
  Filled 2020-05-24: qty 50

## 2020-05-24 MED ORDER — SODIUM CHLORIDE 0.9 % IV BOLUS
1000.0000 mL | Freq: Once | INTRAVENOUS | Status: AC
  Administered 2020-05-24: 1000 mL via INTRAVENOUS

## 2020-05-24 MED ORDER — DIPHENHYDRAMINE HCL 50 MG/ML IJ SOLN
25.0000 mg | Freq: Once | INTRAMUSCULAR | Status: AC
  Administered 2020-05-25: 25 mg via INTRAVENOUS
  Filled 2020-05-24: qty 1

## 2020-05-24 MED ORDER — LORAZEPAM 2 MG/ML IJ SOLN: 0.0000 mg | Freq: Four times a day (QID) | INTRAMUSCULAR | Status: DC

## 2020-05-24 MED ORDER — LORAZEPAM 2 MG/ML IJ SOLN: 1.0000 mg | INTRAMUSCULAR | Status: DC | PRN

## 2020-05-24 MED ORDER — ADULT MULTIVITAMIN W/MINERALS CH: 1.0000 | ORAL_TABLET | Freq: Every day | ORAL | Status: DC

## 2020-05-24 MED ORDER — LORAZEPAM 1 MG PO TABS: 1.0000 mg | ORAL_TABLET | ORAL | Status: DC | PRN

## 2020-05-24 NOTE — Progress Notes (Signed)
Orthopedic Tech Progress Note Patient Details:  TOSHIRO HANKEN 1981-02-20 [id number] Level 2 trauma Patient ID: Hulan Fray, male   DOB: [birth year], 39 y.o.   MRN: [id number]   Michelle Piper 05/24/2020, 9:18 PM

## 2020-05-24 NOTE — ED Notes (Signed)
To ct

## 2020-05-24 NOTE — ED Provider Notes (Signed)
[facility] EMERGENCY DEPARTMENT Provider Note   CSN: [id number] Arrival date & time: 05/24/20  2051     History Chief Complaint  Patient presents with  . Seizures    Joshua HAUPERT is a 39 y.o. male.  The history is provided by the patient and medical records. No language interpreter was used.  Trauma Mechanism of injury: fall Injury location: head/neck Injury location detail: head Incident location: at work Arrived directly from scene: yes   Fall:      Fall occurred: standing      Point of impact: head      Entrapped after fall: no      Suspicion of alcohol use: no      Suspicion of drug use: no  EMS/PTA data:      Ambulatory at scene: no      Loss of consciousness: yes      Airway interventions: none      Mental status condition since incident: improving  Current symptoms:      Associated symptoms:            Reports headache, loss of consciousness and seizures.            Denies abdominal pain, back pain, chest pain, difficulty breathing, nausea, neck pain and vomiting.   Relevant PMH:      Tetanus status: UTD      Past Medical History:  Diagnosis Date  . Anxiety   . Depression     There are no problems to display for this patient.   History reviewed. No pertinent surgical history.     No family history on file.  Social History   Tobacco Use  . Smoking status: Not on file  Substance Use Topics  . Alcohol use: Not on file  . Drug use: Not on file    Home Medications Prior to Admission medications   Medication Sig Start Date End Date Taking? Authorizing Provider  ALPRAZolam Prudy Feeler) 0.5 MG tablet Take 0.5 mg by mouth 2 (two) times daily as needed. 04/22/20   [provider]  escitalopram (LEXAPRO) 10 MG tablet Take 10-20 mg by mouth daily. 03/01/20   [provider]  gabapentin (NEURONTIN) 300 MG capsule Take by mouth. 05/17/20   [provider]  HUMIRA PEN 40 MG/0.4ML PNKT Inject 40 mg into the  skin every 14 (fourteen) days. 05/03/20   [provider]  LORazepam (ATIVAN) 1 MG tablet Take 1 mg by mouth 2 (two) times daily as needed. 04/12/20   [provider]    Allergies    Penicillins  Review of Systems   Review of Systems  Constitutional: Negative for chills, diaphoresis, fatigue and fever.  HENT: Negative for congestion, dental problem and drooling.   Eyes: Negative for visual disturbance.  Respiratory: Negative for cough, chest tightness, shortness of breath and wheezing.   Cardiovascular: Negative for chest pain.  Gastrointestinal: Negative for abdominal pain, constipation, diarrhea, nausea and vomiting.  Genitourinary: Negative for flank pain.  Musculoskeletal: Negative for back pain, neck pain and neck stiffness.  Skin: Positive for wound (tongue abrasion/laceration).  Neurological: Positive for seizures, loss of consciousness and headaches. Negative for dizziness and light-headedness.  Psychiatric/Behavioral: Negative for agitation.  All other systems reviewed and are negative.   Physical Exam Updated Vital Signs BP (!) 149/101   Temp 98.7 F (37.1 C) (Oral)   Resp 18   SpO2 94%   Physical Exam Vitals and nursing note reviewed.  Constitutional:  General: He is not in acute distress.    Appearance: He is well-developed. He is not ill-appearing, toxic-appearing or diaphoretic.  HENT:     Nose: No congestion or rhinorrhea.     Mouth/Throat:     Mouth: Mucous membranes are moist.     Pharynx: Oropharynx is clear. Uvula midline. No oropharyngeal exudate or posterior oropharyngeal erythema.     Tonsils: No tonsillar exudate.      Comments: Linear small hemostatic abrasion/lacerations to the left tongue.   Initially, minimal tongue swelling however over several hours, left tongue has begun to look ecchymotic and swell. Eyes:     Extraocular Movements: Extraocular movements intact.     Conjunctiva/sclera: Conjunctivae normal.     Pupils:  Pupils are equal, round, and reactive to light.  Cardiovascular:     Rate and Rhythm: Normal rate and regular rhythm.     Pulses: Normal pulses.     Heart sounds: No murmur heard.   Pulmonary:     Effort: Pulmonary effort is normal. No respiratory distress.     Breath sounds: Normal breath sounds. No wheezing, rhonchi or rales.  Chest:     Chest wall: No tenderness.  Abdominal:     General: Abdomen is flat.     Palpations: Abdomen is soft.     Tenderness: There is no abdominal tenderness. There is no right CVA tenderness, left CVA tenderness or guarding.  Musculoskeletal:        General: No tenderness.     Cervical back: Neck supple. No tenderness.  Skin:    General: Skin is warm and dry.     Findings: No erythema.  Neurological:     General: No focal deficit present.     Mental Status: He is alert.     Sensory: No sensory deficit.     Motor: No weakness.  Psychiatric:        Mood and Affect: Mood normal.     ED Results / Procedures / Treatments   Labs (all labs ordered are listed, but only abnormal results are displayed) Labs Reviewed  COMPREHENSIVE METABOLIC PANEL - Abnormal; Notable for the following components:      Result Value   Chloride 95 (*)    CO2 11 (*)    Glucose, Bld 159 (*)    AST 211 (*)    ALT 145 (*)    Anion gap 29 (*)    All other components within normal limits  CBC - Abnormal; Notable for the following components:   Platelets 60 (*)    All other components within normal limits  ETHANOL - Abnormal; Notable for the following components:   Alcohol, Ethyl (B) 15 (*)    All other components within normal limits  URINALYSIS, ROUTINE W REFLEX MICROSCOPIC - Abnormal; Notable for the following components:   Hgb urine dipstick SMALL (*)    Ketones, ur 20 (*)    Protein, ur >=300 (*)    All other components within normal limits  LACTIC ACID, PLASMA - Abnormal; Notable for the following components:   Lactic Acid, Venous >11.0 (*)    All other  components within normal limits  RAPID URINE DRUG SCREEN, HOSP PERFORMED - Abnormal; Notable for the following components:   Benzodiazepines POSITIVE (*)    All other components within normal limits  I-STAT CHEM 8, ED - Abnormal; Notable for the following components:   Sodium 134 (*)    Glucose, Bld 155 (*)    Calcium, Ion 1.01 (*)  TCO2 13 (*)    All other components within normal limits  SARS CORONAVIRUS 2 BY RT PCR [facility] ORDER, PERFORMED IN [facility])  PROTIME-INR  BASIC METABOLIC PANEL  LACTIC ACID, PLASMA  LACTIC ACID, PLASMA  SAMPLE TO BLOOD BANK    EKG EKG Interpretation  Date/Time:  Friday May 24 2020 21:09:22 EDT Ventricular Rate:  137 PR Interval:    QRS Duration: 87 QT Interval:  285 QTC Calculation: 431 R Axis:   74 Text Interpretation: Sinus tachycardia Probable left atrial enlargement No prior ECG for comparison. No sTEMI Confirmed by Theda Belfast ([id number]) on 05/24/2020 9:56:14 PM   Radiology CT HEAD WO CONTRAST  Result Date: 05/24/2020 CLINICAL DATA:  Status post trauma with subsequent seizure. EXAM: CT HEAD WITHOUT CONTRAST TECHNIQUE: Contiguous axial images were obtained from the base of the skull through the vertex without intravenous contrast. COMPARISON:  None. FINDINGS: Brain: No evidence of acute infarction, hemorrhage, hydrocephalus, extra-axial collection or mass lesion/mass effect. Vascular: No hyperdense vessel or unexpected calcification. Skull: Normal. Negative for fracture or focal lesion. Sinuses/Orbits: No acute finding. Other: There is mild to moderate severity left occipital scalp soft tissue swelling. IMPRESSION: 1. No acute intracranial abnormality. 2. Mild to moderate severity left occipital scalp soft tissue swelling. Electronically Signed   By: Aram Candela M.D.   On: 05/24/2020 21:52   CT CERVICAL SPINE WO CONTRAST  Result Date: 05/24/2020 CLINICAL DATA:  Status post fall. EXAM: CT CERVICAL SPINE WITHOUT CONTRAST  TECHNIQUE: Multidetector CT imaging of the cervical spine was performed without intravenous contrast. Multiplanar CT image reconstructions were also generated. COMPARISON:  None. FINDINGS: Alignment: Normal. Skull base and vertebrae: No acute fracture. No primary bone lesion or focal pathologic process. Soft tissues and spinal canal: No prevertebral fluid or swelling. No visible canal hematoma. Disc levels: Normal multilevel endplates are seen with normal multilevel intervertebral disc spaces. New level normal multilevel bilateral facet joints are noted. Upper chest: Negative. Other: It should be noted that the study is limited secondary to patient motion. IMPRESSION: No acute fracture or subluxation of the cervical spine. Electronically Signed   By: Aram Candela M.D.   On: 05/24/2020 21:55   DG Chest Port 1 View  Result Date: 05/24/2020 CLINICAL DATA:  Status post fall. EXAM: PORTABLE CHEST 1 VIEW COMPARISON:  None. FINDINGS: The heart size and mediastinal contours are within normal limits. Both lungs are clear. The visualized skeletal structures are unremarkable. IMPRESSION: No active disease. Electronically Signed   By: Aram Candela M.D.   On: 05/24/2020 21:14    Procedures Procedures (including critical care time)  Medications Ordered in ED Medications  lactated ringers bolus 1,000 mL (has no administration in time range)  LORazepam (ATIVAN) tablet 1-4 mg (has no administration in time range)    Or  LORazepam (ATIVAN) injection 1-4 mg (has no administration in time range)  thiamine tablet 100 mg (has no administration in time range)    Or  thiamine (B-1) injection 100 mg (has no administration in time range)  folic acid (FOLVITE) tablet 1 mg (has no administration in time range)  multivitamin with minerals tablet 1 tablet (has no administration in time range)  LORazepam (ATIVAN) injection 0-4 mg (has no administration in time range)    Followed by  LORazepam (ATIVAN) injection 0-4  mg (has no administration in time range)  dexamethasone (DECADRON) injection 10 mg (has no administration in time range)  diphenhydrAMINE (BENADRYL) injection 25 mg (has no administration in time  range)  famotidine (PEPCID) IVPB 20 mg premix (has no administration in time range)  sodium chloride 0.9 % bolus 1,000 mL (0 mLs Intravenous Stopped 05/24/20 2253)  LORazepam (ATIVAN) injection 1 mg (1 mg Intravenous Given 05/24/20 2115)  sodium chloride 0.9 % bolus 1,000 mL (1,000 mLs Intravenous New Bag/Given 05/24/20 2254)    ED Course  I have reviewed the triage vital signs and the nursing notes.  Pertinent labs & imaging results that were available during my care of the patient were reviewed by me and considered in my medical decision making (see chart for details).    MDM Rules/Calculators/A&P                          Joshua Wade is a 39 y.o. male with a past medical history significant for severe anxiety and depression who presents as a level 2 trauma for head injury and seizure.  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  He bit his tongue and had some small amount of bleeding from his nose and mouth.  Patient was unconscious and had approximately 4-minute seizure.  EMS arrived and the patient has been postictal during transport.  He has been tachycardic with rates in the 140s on arrival and was made a level 2 trauma.  On arrival, patient is waking up more.  He is following all commands.  No focal neurologic deficits on my initial exam.  He is alert and does not remember the accident and fall but knows where he is and his name.  He reports headache but denies significant facial pain.  He is denying any chest pain or abdominal pain.  Oxygen was in the upper 80s on arrival likely due to postictal state.  Due to the patient's anxiety and tachycardia, I do suspect some of this related to his worsening anxiety being in the emergency department.  He was given a  dose of Ativan initially to help with his symptoms.  Airway initially clear on arrival.  Mental status is improving and GCS is 14.  On oral exam, patient does have several linear abrasions/lacerations from teeth marks on the left side of his tongue.  They are hemostatic on initial evaluation.  No stridor.  Minimal swelling.  Lungs clear and chest was nontender.  Neck was nontender.  Patient moving all extremities.  No tenderness in extremities.  Good pulses in extremities.  No focal neurologic deficits.  Pupils are symmetric and reactive normal extraocular movements.  Decision made to get a CT head and neck as well as portable chest x-ray and trauma labs.  CT head and neck showed no acute traumatic injuries.  Chest x-ray also reassuring.  Patient de-escalate from oxygen and is on room air now.  Collar was removed.  Patient had screening labs which did show elevated LFTs, elevated lactic acid which may be related to his recent seizure, and low platelets.  Unclear etiology of the symptoms however patient reports he does drink and has had elevated liver enzymes in the past he reports.  On reassessment, patient's left tongue has continued to swell and he now has a abnormal sounding voice.  He is tolerating his secretions however the swelling appears to be gradually worsening.  Suspect it was related to his thrombocytopenia.  He is not on blood thinners.  Urinalysis does not show infection.  Creatinine normal.  He reports his tetanus is up-to-date.  No history  of seizures.  I touch base with neurology who did not feel he needed to be started on antiepileptic medications if he is back to his baseline which she does appear to be.  They did recommend he not drive for 6 months and follow-up in both outpatient neurology clinic as well as concussion clinic as I suspect he had a severe concussion leading to his seizure.  He did have alcohol in his system, low suspicion for alcohol withdrawal.    11:52 PM Care  transferred to Dr. Manus Gunningancour.  Plan of care will be to continue watching the tongue swelling to make sure it does not worsen.  If continues to worsen or he starts having difficulty with secretions or difficulty breathing, patient may need prophylactic intubation.  We will trend his lactic acid that was elevated likely due to the seizure.  Will likely get other repeat labs as well.  He is still getting more fluids.  Family is in agreement with repeat labs and monitoring.  With the tongue swelling and low platelets, patient may require admission even if it does not continue to worsen.   Anticipate reassessment by oncoming team for close airway monitoring.   Final Clinical Impression(s) / ED Diagnoses Final diagnoses:  Trauma  Traumatic injury of head, initial encounter  Fall, initial encounter  Seizure [facility](HCC)  Injury of tongue, initial encounter  Thrombocytopenia (HCC)  LFT elevation    Clinical Impression: 1. Traumatic injury of head, initial encounter   2. Trauma   3. Fall, initial encounter   4. Seizure (HCC)   5. Injury of tongue, initial encounter   6. Thrombocytopenia (HCC)   7. LFT elevation      Disposition: Care transferred to oncoming team while monitoring his tongue swelling and repeat laboratory testing.    This note was prepared with assistance of Conservation officer, historic buildingsDragon voice recognition software. Occasional wrong-word or sound-a-like substitutions may have occurred due to the inherent limitations of voice recognition software.     Esau Fridman, Canary Brimhristopher J, MD 05/25/20 0003

## 2020-05-24 NOTE — ED Provider Notes (Signed)
Care assumed from Dr. Rush Landmark.  Patient here with slip and fall at work with head injury followed by tonic-clonic seizure.  Did bite his tongue.  Tachycardic and tremulous.  Has been prescribed benzodiazepines but states his has not had them for several years.  Imaging is negative.  Patient remains tachycardic.  Large lactic acidosis with anion gap.  He is hydrated. Tongue swelling has progressively worsened and patient is having some trouble swallowing secretions.  He denies any difficulty breathing. Does have thrombocytopenia of 60.  CIWA protocol initiated.  Hydration is continued.  Discussed with Dr. Fredricka Bonine of trauma surgery.  She will evaluate the patient in consult but is requesting medical admission.  We will continue hydration, CIWA protocol, correction of electrolytes.  We will monitor for progressive worsening of his tongue swelling. Monitor for alcohol withdrawal.  Admission discussed with Dr. Toniann Fail.  D/w Dr. Leta Baptist on call for ENT.  She states not much to do her patient has tongue hematoma.  Recommends monitoring and getting pain control. States it will likely get worse before it gets better.  No anticoagulation to reverse.  She does not feel like a facial CT would be helpful. May need intubation if progresses.  Patient reassessed multiple times throughout ED course.  Tongue remains swollen but unchanged.  Patient still with no difficulty swallowing or difficulty breathing.  He feels like it is not worsening. Tongue swelling only be watched closely.  Lactic acidosis is improving. Admission discussed with Dr. Toniann Fail.  CRITICAL CARE Performed by: Glynn Octave Total critical care time: 45 minutes Critical care time was exclusive of separately billable procedures and treating other patients. Critical care was necessary to treat or prevent imminent or life-threatening deterioration. Critical care was time spent personally by me on the following activities:  development of treatment plan with patient and/or surrogate as well as nursing, discussions with consultants, evaluation of patient's response to treatment, examination of patient, obtaining history from patient or surrogate, ordering and performing treatments and interventions, ordering and review of laboratory studies, ordering and review of radiographic studies, pulse oximetry and re-evaluation of patient's condition.    Glynn Octave, MD 05/25/20 [phone]

## 2020-05-24 NOTE — ED Triage Notes (Signed)
Pt comes via [facility] from work, fell at work after slipping on some water, large hematoma to back of head. Witnessed tonic clonic seizure lasting 3-4 minutes, no hx of seizures, bit tongue, AxO x 1 to self.

## 2020-05-25 ENCOUNTER — Encounter (HOSPITAL_COMMUNITY): Payer: Self-pay | Admitting: Internal Medicine

## 2020-05-25 DIAGNOSIS — D696 Thrombocytopenia, unspecified: Secondary | ICD-10-CM | POA: Diagnosis present

## 2020-05-25 DIAGNOSIS — R569 Unspecified convulsions: Secondary | ICD-10-CM | POA: Diagnosis not present

## 2020-05-25 DIAGNOSIS — F1010 Alcohol abuse, uncomplicated: Secondary | ICD-10-CM | POA: Diagnosis not present

## 2020-05-25 LAB — BASIC METABOLIC PANEL
Anion gap: 12 (ref 5–15)
Anion gap: 13 (ref 5–15)
BUN: 7 mg/dL (ref 6–20)
BUN: 8 mg/dL (ref 6–20)
CO2: 23 mmol/L (ref 22–32)
CO2: 24 mmol/L (ref 22–32)
Calcium: 8.8 mg/dL — ABNORMAL LOW (ref 8.9–10.3)
Calcium: 9 mg/dL (ref 8.9–10.3)
Chloride: 100 mmol/L (ref 98–111)
Chloride: 101 mmol/L (ref 98–111)
Creatinine, Ser: 0.72 mg/dL (ref 0.61–1.24)
Creatinine, Ser: 0.79 mg/dL (ref 0.61–1.24)
GFR calc Af Amer: >60 mL/min (ref >60)
GFR calc Af Amer: >60 mL/min (ref >60)
GFR calc non Af Amer: >60 mL/min (ref >60)
GFR calc non Af Amer: >60 mL/min (ref >60)
Glucose, Bld: 103 mg/dL — ABNORMAL HIGH (ref 70–99)
Glucose, Bld: 112 mg/dL — ABNORMAL HIGH (ref 70–99)
Potassium: 3.8 mmol/L (ref 3.5–5.1)
Potassium: 3.9 mmol/L (ref 3.5–5.1)
Sodium: 136 mmol/L (ref 135–145)
Sodium: 137 mmol/L (ref 135–145)

## 2020-05-25 LAB — LACTIC ACID, PLASMA
Lactic Acid, Venous: 0.9 mmol/L (ref 0.5–1.9)
Lactic Acid, Venous: 0.9 mmol/L (ref 0.5–1.9)
Lactic Acid, Venous: 1.8 mmol/L (ref 0.5–1.9)

## 2020-05-25 LAB — CBC
HCT: 37.7 % — ABNORMAL LOW (ref 39.0–52.0)
Hemoglobin: 12.4 g/dL — ABNORMAL LOW (ref 13.0–17.0)
MCH: 31.5 pg (ref 26.0–34.0)
MCHC: 32.9 g/dL (ref 30.0–36.0)
MCV: 95.7 fL (ref 80.0–100.0)
Platelets: 46 10*3/uL — ABNORMAL LOW (ref 150–400)
RBC: 3.94 MIL/uL — ABNORMAL LOW (ref 4.22–5.81)
RDW: 13.6 % (ref 11.5–15.5)
WBC: 6.5 10*3/uL (ref 4.0–10.5)
nRBC: 0 % (ref 0.0–0.2)

## 2020-05-25 LAB — SARS CORONAVIRUS 2 BY RT PCR (HOSPITAL ORDER, PERFORMED IN ~~LOC~~ HOSPITAL LAB): SARS Coronavirus 2: NEGATIVE

## 2020-05-25 LAB — HEPATITIS PANEL, ACUTE
HCV Ab: NONREACTIVE
Hep A IgM: NONREACTIVE
Hep B C IgM: NONREACTIVE
Hepatitis B Surface Ag: NONREACTIVE

## 2020-05-25 LAB — IMMATURE PLATELET FRACTION: Immature Platelet Fraction: 11.3 % — ABNORMAL HIGH (ref 1.2–8.6)

## 2020-05-25 LAB — HIV ANTIBODY (ROUTINE TESTING W REFLEX): HIV Screen 4th Generation wRfx: NONREACTIVE

## 2020-05-25 MED ORDER — FENTANYL CITRATE (PF) 100 MCG/2ML IJ SOLN
50.0000 ug | Freq: Once | INTRAMUSCULAR | Status: AC
  Administered 2020-05-25: 50 ug via INTRAVENOUS
  Filled 2020-05-25: qty 2

## 2020-05-25 MED ORDER — MORPHINE SULFATE (PF) 2 MG/ML IV SOLN
1.0000 mg | INTRAVENOUS | Status: DC | PRN
  Administered 2020-05-25: 1 mg via INTRAVENOUS
  Filled 2020-05-25: qty 1

## 2020-05-25 MED ORDER — LORAZEPAM 2 MG/ML IJ SOLN: 0.0000 mg | Freq: Two times a day (BID) | INTRAMUSCULAR | Status: DC

## 2020-05-25 MED ORDER — LORAZEPAM 2 MG/ML IJ SOLN: 1.0000 mg | INTRAMUSCULAR | Status: DC | PRN

## 2020-05-25 MED ORDER — FOLIC ACID 1 MG PO TABS
1.0000 mg | ORAL_TABLET | Freq: Every day | ORAL | Status: DC
  Administered 2020-05-25: 1 mg via ORAL
  Filled 2020-05-25: qty 1

## 2020-05-25 MED ORDER — ONDANSETRON HCL 4 MG PO TABS: 4.0000 mg | ORAL_TABLET | Freq: Four times a day (QID) | ORAL | Status: DC | PRN

## 2020-05-25 MED ORDER — BENZOCAINE 10 % MT GEL
Freq: Four times a day (QID) | OROMUCOSAL | Status: DC | PRN
  Filled 2020-05-25: qty 9

## 2020-05-25 MED ORDER — LORAZEPAM 1 MG PO TABS: 1.0000 mg | ORAL_TABLET | ORAL | Status: DC | PRN

## 2020-05-25 MED ORDER — SODIUM CHLORIDE 0.9 % IV SOLN
INTRAVENOUS | Status: DC
  Administered 2020-05-25: 1000 mL via INTRAVENOUS

## 2020-05-25 MED ORDER — GABAPENTIN 300 MG PO CAPS
300.0000 mg | ORAL_CAPSULE | Freq: Every day | ORAL | Status: DC
  Administered 2020-05-25: 300 mg via ORAL
  Filled 2020-05-25: qty 1

## 2020-05-25 MED ORDER — OXYCODONE HCL 5 MG PO TABS
5.0000 mg | ORAL_TABLET | ORAL | Status: DC | PRN
  Administered 2020-05-25 (×2): 5 mg via ORAL
  Filled 2020-05-25 (×2): qty 1

## 2020-05-25 MED ORDER — ONDANSETRON HCL 4 MG/2ML IJ SOLN: 4.0000 mg | Freq: Four times a day (QID) | INTRAMUSCULAR | Status: DC | PRN

## 2020-05-25 MED ORDER — ADULT MULTIVITAMIN W/MINERALS CH
1.0000 | ORAL_TABLET | Freq: Every day | ORAL | Status: DC
  Administered 2020-05-25: 1 via ORAL
  Filled 2020-05-25: qty 1

## 2020-05-25 MED ORDER — ESCITALOPRAM OXALATE 10 MG PO TABS
5.0000 mg | ORAL_TABLET | Freq: Every day | ORAL | Status: DC
  Administered 2020-05-25: 5 mg via ORAL
  Filled 2020-05-25: qty 1

## 2020-05-25 MED ORDER — THIAMINE HCL 100 MG/ML IJ SOLN: 100.0000 mg | Freq: Every day | INTRAMUSCULAR | Status: DC

## 2020-05-25 MED ORDER — LORAZEPAM 2 MG/ML IJ SOLN: 0.0000 mg | Freq: Four times a day (QID) | INTRAMUSCULAR | Status: DC

## 2020-05-25 MED ORDER — THIAMINE HCL 100 MG PO TABS
100.0000 mg | ORAL_TABLET | Freq: Every day | ORAL | Status: DC
  Administered 2020-05-25: 100 mg via ORAL
  Filled 2020-05-25: qty 1

## 2020-05-25 NOTE — Progress Notes (Signed)
Patient arrived to unit, A&OX4. Seizure precautions initiated, verified on telemetry. Dinner ordered

## 2020-05-25 NOTE — ED Notes (Signed)
Lunch Tray Ordered @ 1039. 

## 2020-05-25 NOTE — Progress Notes (Signed)
PROGRESS NOTE    Joshua FrayRobert C Wade  NWG:[id number] DOB: 19-Jun-1981 DOA: 05/24/2020 PCP: Marden NobleGates, Stewart, MD      Brief Narrative:  Joshua Wade is a 39 y.o. M with hx Psoriatic arthritis, panic attacks, peripheral neuropathy, prior heavy alcohol use who presented with fall and seizure.  The patient has no memory of the event, but the story given is that he fell, struck his head, lost consciousness and then had a GTC seizure for several minutes.  EMS were activated, and found he had a bloody nose and tongue bite.  In the ER, he was initially presented as a level 2 trauma.  Ct head and c-spine showed no fractures or hemorrhage.  CXR normal.  There was a large hematoma of the tongue.  ENT were consulted, who recommended conservative monitoring.  Trauma surgery were consulted, had no additional recommendations.  Patient noted to have isolated platelets 60K.  Also noted to have transaminitis.       Assessment & Plan:  Seizure No prior history seizures.  Per report this occurred after a trauma to the head.  Alcohol and Xanax use likely also are contributing.  Discussed with Neurology by Dr. Toniann FailKakrakandy, no AED recommended.   -Seizure precautions -Follow up with Dr. Allena KatzPatel -No driving for 6 months -Alcohol cessation recommended.   Thrombocytopenia Isolated.  Given the history of alcohol use, hepatitis, I suspect this is secondary to liver disease.  Less likely ITP. -Obtain US abdomen to evaluate for hypersplenism/portal HTN -Check HIV, HCV  Acute hepatitis This is likely from alcohol.  Right upper quadrant ultrasound from a year ago showed steatosis at that point.  Albumin and INR are normal.  Platelets are low. -Check viral hepatitis serologies -Obtain abdomen US -Trend LFTs  Tongue laceration This appears stable -Serial exams of tongue -Oragel as needed  Polyneuropathy Follows with Dr. Allena KatzPatel who suspects alcohol is partially responsible. -Continue gabapentin  Panic  disorder -Continue Escitalopram -Avoid Xanax, associated with seizures  Alcohol use disorder, severe with uncomplicated withdrawal He appears to me to have moderate tremor, mild alcohol withdrawal symptoms.  The patient minimizes his alcohol use. -Continue thiamine and folate -Continue CIWA scoring with on-demand lorazepam  Elevated anion gap metabolic acidosis Due to lactic acidosis from seizure.  Completely resolved.  Psoriatic arthritis No active disease.  He is on Humira          Disposition: Status is: Observation  The patient remains OBS appropriate and will d/c before 2 midnights.  Dispo: The patient is from: Home              Anticipated d/c is to: Home              Anticipated d/c date is: 1 day              Patient currently is not medically stable to d/c.              MDM: This is a no charge note.  For further details, please see H&P by my partner Dr. Toniann Failkakrakandy from earlier today.  The below labs and imaging reports were reviewed and summarized above.    DVT prophylaxis: SCDs Start: 05/25/20 0139  Code Status: FULL Family Communication: wife    Consultants:   Trauma  Procedures:     Antimicrobials:      Culture data:              Subjective: Patient is somewhat tremulous.  His voice is back to normal.  He is weak in his feet.  He has neuropathy in both feet.        Objective: Vitals:   05/25/20 0630 05/25/20 0645 05/25/20 0802 05/25/20 1005  BP: (!) 146/94 (!) 142/95 (!) 141/95 (!) 135/91  Pulse: 71 70 78 77  Resp: 13 11 16 16   Temp:      TempSrc:      SpO2: 95% 98% 98% 98%    Intake/Output Summary (Last 24 hours) at 05/25/2020 1459 Last data filed at 05/25/2020 0208 Gross per 24 hour  Intake 2500 ml  Output 1050 ml  Net 1450 ml   There were no vitals filed for this visit.  Examination: The patient was seen and examined.  The tongue is swollen, but his voice is normal, he has no respiratory  discomfort difficulty.  He is speaking normally.    Data Reviewed: I have personally reviewed following labs and imaging studies:  CBC: Recent Labs  Lab 05/24/20 2102 05/24/20 2130 05/25/20 0313  WBC 6.0  --  6.5  HGB 13.9 15.3 12.4*  HCT 43.4 45.0 37.7*  MCV 98.2  --  95.7  PLT 60*  --  46*   Basic Metabolic Panel: Recent Labs  Lab 05/24/20 2102 05/24/20 2130 05/25/20 0002 05/25/20 0313  NA 135 134* 136 137  K 4.1 4.0 3.8 3.9  CL 95* 98 100 101  CO2 11*  --  24 23  GLUCOSE 159* 155* 103* 112*  BUN 8 8 8 7   CREATININE 0.98 0.70 0.79 0.72  CALCIUM 9.3  --  9.0 8.8*   GFR: CrCl cannot be calculated (Unknown ideal weight.). Liver Function Tests: Recent Labs  Lab 05/24/20 2102  AST 211*  ALT 145*  ALKPHOS 65  BILITOT 0.9  PROT 7.2  ALBUMIN 4.4   No results for input(s): LIPASE, AMYLASE in the last 168 hours. No results for input(s): AMMONIA in the last 168 hours. Coagulation Profile: Recent Labs  Lab 05/24/20 2102  INR 1.1   Cardiac Enzymes: No results for input(s): CKTOTAL, CKMB, CKMBINDEX, TROPONINI in the last 168 hours. BNP (last 3 results) No results for input(s): PROBNP in the last 8760 hours. HbA1C: No results for input(s): HGBA1C in the last 72 hours. CBG: No results for input(s): GLUCAP in the last 168 hours. Lipid Profile: No results for input(s): CHOL, HDL, LDLCALC, TRIG, CHOLHDL, LDLDIRECT in the last 72 hours. Thyroid Function Tests: No results for input(s): TSH, T4TOTAL, FREET4, T3FREE, THYROIDAB in the last 72 hours. Anemia Panel: No results for input(s): VITAMINB12, FOLATE, FERRITIN, TIBC, IRON, RETICCTPCT in the last 72 hours. Urine analysis:    Component Value Date/Time   COLORURINE YELLOW 05/24/2020 2308   APPEARANCEUR CLEAR 05/24/2020 2308   LABSPEC 1.019 05/24/2020 2308   PHURINE 6.0 05/24/2020 2308   GLUCOSEU NEGATIVE 05/24/2020 2308   HGBUR SMALL (A) 05/24/2020 2308   BILIRUBINUR NEGATIVE 05/24/2020 2308   KETONESUR 20  (A) 05/24/2020 2308   PROTEINUR >=300 (A) 05/24/2020 2308   NITRITE NEGATIVE 05/24/2020 2308   LEUKOCYTESUR NEGATIVE 05/24/2020 2308   Sepsis Labs: @LABRCNTIP (procalcitonin:4,lacticacidven:4)  ) Recent Results (from the past 240 hour(s))  SARS Coronavirus 2 by RT PCR (hospital order, performed in [facility] hospital lab) Nasopharyngeal Nasopharyngeal Swab     Status: None   Collection Time: 05/25/20  1:10 AM   Specimen: Nasopharyngeal Swab  Result Value Ref Range Status   SARS Coronavirus 2 NEGATIVE NEGATIVE Final    Comment: (NOTE) SARS-CoV-2 target nucleic acids are NOT  DETECTED.  The SARS-CoV-2 RNA is generally detectable in upper and lower respiratory specimens during the acute phase of infection. The lowest concentration of SARS-CoV-2 viral copies this assay can detect is 250 copies / mL. A negative result does not preclude SARS-CoV-2 infection and should not be used as the sole basis for treatment or other patient management decisions.  A negative result may occur with improper specimen collection / handling, submission of specimen other than nasopharyngeal swab, presence of viral mutation(s) within the areas targeted by this assay, and inadequate number of viral copies (<250 copies / mL). A negative result must be combined with clinical observations, patient history, and epidemiological information.  Fact Sheet for Patients:   BoilerBrush.com.cy  Fact Sheet for Healthcare Providers: https://pope.com/  This test is not yet approved or  cleared by the [place] FDA and has been authorized for detection and/or diagnosis of SARS-CoV-2 by FDA under an Emergency Use Authorization (EUA).  This EUA will remain in effect (meaning this test can be used) for the duration of the COVID-19 declaration under Section 564(b)(1) of the Act, 21 U.S.C. section 360bbb-3(b)(1), unless the authorization is terminated or revoked  sooner.  Performed at [facility], [address], [place], [place] [address]          Radiology Studies: CT HEAD WO CONTRAST  Result Date: 05/24/2020 CLINICAL DATA:  Status post trauma with subsequent seizure. EXAM: CT HEAD WITHOUT CONTRAST TECHNIQUE: Contiguous axial images were obtained from the base of the skull through the vertex without intravenous contrast. COMPARISON:  None. FINDINGS: Brain: No evidence of acute infarction, hemorrhage, hydrocephalus, extra-axial collection or mass lesion/mass effect. Vascular: No hyperdense vessel or unexpected calcification. Skull: Normal. Negative for fracture or focal lesion. Sinuses/Orbits: No acute finding. Other: There is mild to moderate severity left occipital scalp soft tissue swelling. IMPRESSION: 1. No acute intracranial abnormality. 2. Mild to moderate severity left occipital scalp soft tissue swelling. Electronically Signed   By: Aram Candela M.D.   On: 05/24/2020 21:52   CT CERVICAL SPINE WO CONTRAST  Result Date: 05/24/2020 CLINICAL DATA:  Status post fall. EXAM: CT CERVICAL SPINE WITHOUT CONTRAST TECHNIQUE: Multidetector CT imaging of the cervical spine was performed without intravenous contrast. Multiplanar CT image reconstructions were also generated. COMPARISON:  None. FINDINGS: Alignment: Normal. Skull base and vertebrae: No acute fracture. No primary bone lesion or focal pathologic process. Soft tissues and spinal canal: No prevertebral fluid or swelling. No visible canal hematoma. Disc levels: Normal multilevel endplates are seen with normal multilevel intervertebral disc spaces. New level normal multilevel bilateral facet joints are noted. Upper chest: Negative. Other: It should be noted that the study is limited secondary to patient motion. IMPRESSION: No acute fracture or subluxation of the cervical spine. Electronically Signed   By: Aram Candela M.D.   On: 05/24/2020 21:55   DG Chest Port 1 View  Result Date:  05/24/2020 CLINICAL DATA:  Status post fall. EXAM: PORTABLE CHEST 1 VIEW COMPARISON:  None. FINDINGS: The heart size and mediastinal contours are within normal limits. Both lungs are clear. The visualized skeletal structures are unremarkable. IMPRESSION: No active disease. Electronically Signed   By: Aram Candela M.D.   On: 05/24/2020 21:14        Scheduled Meds: . escitalopram  5 mg Oral Daily  . folic acid  1 mg Oral Daily  . gabapentin  300 mg Oral Daily  . LORazepam  0-4 mg Intravenous Q6H   Followed by  . [  START ON 05/27/2020] LORazepam  0-4 mg Intravenous Q12H  . multivitamin with minerals  1 tablet Oral Daily  . thiamine  100 mg Oral Daily   Or  . thiamine  100 mg Intravenous Daily   Continuous Infusions: . sodium chloride 1,000 mL (05/25/20 1011)     LOS: 0 days    Time spent: 35 minutes    Alberteen Sam, MD [facility] 05/25/2020, 2:59 PM     Please page though AMION or Epic secure chat:  For password, contact charge nurse

## 2020-05-25 NOTE — Progress Notes (Signed)
Pt states that he does not want his vital signs taken at 4AM.

## 2020-05-25 NOTE — Progress Notes (Signed)
Pt refused midnight dose of ativan. States he does not take ativan.

## 2020-05-25 NOTE — ED Notes (Signed)
hospitalist at  The bedside

## 2020-05-25 NOTE — H&P (Addendum)
History and Physical    Joshua Wade:[id number] DOB: 09/09/1981 DOA: 05/24/2020  PCP: Marden Noble, MD  Patient coming from: Home.  Chief Complaint: Fall and seizures.  HPI: Joshua Wade is a 39 y.o. male with history of alcohol abuse, anxiety and alcohol-related neuropathy on gabapentin was brought to the ER after patient was witnessed to have a tonic-clonic seizure at his workplace.  Patient states he fell onto the floor for which he hit his head and was noticed to have a tonic-clonic seizure lasting for around 4 minutes.  During the episode he bit his tongue.  He was postictal and was brought to the ER.  Denies any headache or weakness of upper or lower extremities.  Admits to drinking alcohol every day.  ED Course: In the ER patient was given Ativan IV.  CT head and C-spine was unremarkable.  Patient's tongue was swollen.  Labs are remarkable for thrombocytopenia of 60.  Lactic acid was more than 11 with anion gap of 29.  Patient's AST and ALT were elevated.  Trauma service was consulted.  At this time patient is admitted for further observation for seizure likely related to trauma.  Review of Systems: As per HPI, rest all negative.   Past Medical History:  Diagnosis Date   Anxiety    Depression     Past Surgical History:  Procedure Laterality Date   Surgery for cryptorchidism       reports that he has been smoking. He has never used smokeless tobacco. He reports current alcohol use. No history on file for drug use.  Allergies  Allergen Reactions   Penicillins     Childhood     Family History  Problem Relation Age of Onset   Seizures Neg Hx     Prior to Admission medications   Medication Sig Start Date End Date Taking? Authorizing Provider  ALPRAZolam Prudy Feeler) 0.5 MG tablet Take 0.5 mg by mouth as needed for anxiety.  04/22/20  Yes [provider]  escitalopram (LEXAPRO) 10 MG tablet Take 5 mg by mouth daily.  03/01/20  Yes [provider]  gabapentin (NEURONTIN) 300 MG capsule Take 300 mg by mouth daily.  05/17/20  Yes [provider]  HUMIRA PEN 40 MG/0.4ML PNKT Inject 40 mg into the skin every 14 (fourteen) days. 05/03/20  Yes [provider]  LORazepam (ATIVAN) 1 MG tablet Take 1 mg by mouth as needed for anxiety.  04/12/20  Yes [provider]    Physical Exam: Constitutional: Moderately built and nourished. Vitals:   05/25/20 0000 05/25/20 0015 05/25/20 0030 05/25/20 0117  BP: (!) 159/110 (!) 158/97 (!) 143/105 (!) 153/98  Pulse: (!) 113 (!) 116 (!) 110 (!) 101  Resp: (!) 22 17 17 17   Temp:      TempSrc:      SpO2: 94% 96% 94% 94%   Eyes: Anicteric no pallor. ENMT: Tongue is swollen mostly on the left lateral side after he had bite. Neck: No mass felt.  No neck rigidity. Respiratory: No rhonchi or crepitations. Cardiovascular: S1-S2 heard. Abdomen: Soft nontender bowel sounds present. Musculoskeletal: No edema. Skin: No rash. Neurologic: Alert awake oriented time place and person.  Moves all extremities. Psychiatric: Appears normal.   Labs on Admission: I have personally reviewed following labs and imaging studies  CBC: Recent Labs  Lab 05/24/20 2102 05/24/20 2130  WBC 6.0  --   HGB 13.9 15.3  HCT 43.4 45.0  MCV 98.2  --  PLT 60*  --    Basic Metabolic Panel: Recent Labs  Lab 05/24/20 2102 05/24/20 2130 05/25/20 0002  NA 135 134* 136  K 4.1 4.0 3.8  CL 95* 98 100  CO2 11*  --  24  GLUCOSE 159* 155* 103*  BUN 8 8 8   CREATININE 0.98 0.70 0.79  CALCIUM 9.3  --  9.0   GFR: CrCl cannot be calculated (Unknown ideal weight.). Liver Function Tests: Recent Labs  Lab 05/24/20 2102  AST 211*  ALT 145*  ALKPHOS 65  BILITOT 0.9  PROT 7.2  ALBUMIN 4.4   No results for input(s): LIPASE, AMYLASE in the last 168 hours. No results for input(s): AMMONIA in the last 168 hours. Coagulation Profile: Recent Labs  Lab 05/24/20 2102  INR 1.1   Cardiac Enzymes: No  results for input(s): CKTOTAL, CKMB, CKMBINDEX, TROPONINI in the last 168 hours. BNP (last 3 results) No results for input(s): PROBNP in the last 8760 hours. HbA1C: No results for input(s): HGBA1C in the last 72 hours. CBG: No results for input(s): GLUCAP in the last 168 hours. Lipid Profile: No results for input(s): CHOL, HDL, LDLCALC, TRIG, CHOLHDL, LDLDIRECT in the last 72 hours. Thyroid Function Tests: No results for input(s): TSH, T4TOTAL, FREET4, T3FREE, THYROIDAB in the last 72 hours. Anemia Panel: No results for input(s): VITAMINB12, FOLATE, FERRITIN, TIBC, IRON, RETICCTPCT in the last 72 hours. Urine analysis:    Component Value Date/Time   COLORURINE YELLOW 05/24/2020 2308   APPEARANCEUR CLEAR 05/24/2020 2308   LABSPEC 1.019 05/24/2020 2308   PHURINE 6.0 05/24/2020 2308   GLUCOSEU NEGATIVE 05/24/2020 2308   HGBUR SMALL (A) 05/24/2020 2308   BILIRUBINUR NEGATIVE 05/24/2020 2308   KETONESUR 20 (A) 05/24/2020 2308   PROTEINUR >=300 (A) 05/24/2020 2308   NITRITE NEGATIVE 05/24/2020 2308   LEUKOCYTESUR NEGATIVE 05/24/2020 2308   Sepsis Labs: @LABRCNTIP (procalcitonin:4,lacticidven:4) )No results found for this or any previous visit (from the past 240 hour(s)).   Radiological Exams on Admission: CT HEAD WO CONTRAST  Result Date: 05/24/2020 CLINICAL DATA:  Status post trauma with subsequent seizure. EXAM: CT HEAD WITHOUT CONTRAST TECHNIQUE: Contiguous axial images were obtained from the base of the skull through the vertex without intravenous contrast. COMPARISON:  None. FINDINGS: Brain: No evidence of acute infarction, hemorrhage, hydrocephalus, extra-axial collection or mass lesion/mass effect. Vascular: No hyperdense vessel or unexpected calcification. Skull: Normal. Negative for fracture or focal lesion. Sinuses/Orbits: No acute finding. Other: There is mild to moderate severity left occipital scalp soft tissue swelling. IMPRESSION: 1. No acute intracranial abnormality. 2.  Mild to moderate severity left occipital scalp soft tissue swelling. Electronically Signed   By: M.D.   On: 05/24/2020 21:52   CT CERVICAL SPINE WO CONTRAST  Result Date: 05/24/2020 CLINICAL DATA:  Status post fall. EXAM: CT CERVICAL SPINE WITHOUT CONTRAST TECHNIQUE: Multidetector CT imaging of the cervical spine was performed without intravenous contrast. Multiplanar CT image reconstructions were also generated. COMPARISON:  None. FINDINGS: Alignment: Normal. Skull base and vertebrae: No acute fracture. No primary bone lesion or focal pathologic process. Soft tissues and spinal canal: No prevertebral fluid or swelling. No visible canal hematoma. Disc levels: Normal multilevel endplates are seen with normal multilevel intervertebral disc spaces. New level normal multilevel bilateral facet joints are noted. Upper chest: Negative. Other: It should be noted that the study is limited secondary to patient motion. IMPRESSION: No acute fracture or subluxation of the cervical spine. Electronically Signed   By: 07/25/2020.D.  On: 05/24/2020 21:55   DG Chest Port 1 View  Result Date: 05/24/2020 CLINICAL DATA:  Status post fall. EXAM: PORTABLE CHEST 1 VIEW COMPARISON:  None. FINDINGS: The heart size and mediastinal contours are within normal limits. Both lungs are clear. The visualized skeletal structures are unremarkable. IMPRESSION: No active disease. Electronically Signed   By: Aram Candela M.D.   On: 05/24/2020 21:14    EKG: Independently reviewed.  Sinus tachycardia.  Assessment/Plan Principal Problem:   Seizure [facility]) Active Problems:   Thrombocytopenia (HCC)   Alcohol abuse   Seizures (HCC)    1. Seizure likely precipitated by trauma.  Discussed with Dr. Georgiana Spinner Aroor on-call neurologist who at this time recommended just placing patient on CIWA protocol.  No antiepileptic neck surgery for now.  Patient can't drive for 6 months.  Closely monitor. 2. Anion gap  metabolic acidosis due to lactic acidosis likely precipitated by seizures.  Gently hydrate and follow metabolic panel. 3. Alcohol abuse  -on CIWA protocol.  Social work consult. 4. Thrombocytopenia could be related to alcoholism.  No old labs to compare.  Follow CBC closely. 5. History of anxiety takes Xanax as needed along with Lexapro. 6. 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 7. History of psoriasis on Humira. 8. Elevated LFTs consistent with alcoholic hepatitis.  Follow LFTs.   DVT prophylaxis: SCDs.  Avoiding anticoagulation due to thrombocytopenia. Code Status: Full code. Family Communication: Patient's wife at the bedside. Disposition Plan: Home. Consults called: Trauma service and discussed with neurologist. Admission status: Observation.   Eduard Clos MD [facility] Pager [phone].  If 7PM-7AM, please contact night-coverage [email or web address] Password [email or web address]  05/25/2020, 1:40 AM

## 2020-05-25 NOTE — ED Notes (Signed)
To bathroom with assistance, steady gait. States he felt good to walk. Tongue is still very sore however he is talking better.

## 2020-05-25 NOTE — ED Notes (Signed)
Breakfast tray given. °

## 2020-05-25 NOTE — Consult Note (Signed)
Surgical Evaluation Requesting provider: Dr. Manus Gunning  Chief Complaint: seizure  HPI: Very pleasant 39 year old man with a history of bilateral lower extremity neuropathy, anxiety/depression, and psoriatic arthritis on Humira who presented as a level 2 trauma earlier this evening after sustaining a fall at work.  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  There was loss of consciousness and then a 4-minute seizure.  He was postictal en route.  At this time he is alert and coherent, reports mild headache and pain/ swelling of the tongue but otherwise denies any pain or injury. Work-up in the emergency room has included a CT of the head neck which are negative for acute traumatic injury.  Chest x-ray is negative.  His mental status has normalized.  He does remain mildly tachycardic.  He is noted to have hematoma and hemostatic laceration of the left side of the tongue.  He reports that this seems to have stabilized as well.  He is tolerating his secretions and maintaining his airway without difficulty.  Labs are notable for AST of 211, ALT of 145, platelets of 60, INR 1.1, anion gap of 29 with a lactate of >11 consistent with known recent seizure.   He reports occasional cigarette use, no drug use, reports " probably too much" alcohol consumption.  Allergies  Allergen Reactions  . Penicillins     Childhood     Past Medical History:  Diagnosis Date  . Anxiety   . Depression     History reviewed. No pertinent surgical history.  No family history on file.  Social History   Socioeconomic History  . Marital status: Married    Spouse name: Not on file  . Number of children: Not on file  . Years of education: Not on file  . Highest education level: Not on file  Occupational History  . Not on file  Tobacco Use  . Smoking status: Not on file  Substance and Sexual Activity  . Alcohol use: Not on file  . Drug use: Not  on file  . Sexual activity: Not on file  Other Topics Concern  . Not on file  Social History Narrative  . Not on file   Social Determinants of Health   Financial Resource Strain:   . Difficulty of Paying Living Expenses:   Food Insecurity:   . Worried About Programme researcher, broadcasting/film/video in the Last Year:   . Barista in the Last Year:   Transportation Needs:   . Freight forwarder (Medical):   Marland Kitchen Lack of Transportation (Non-Medical):   Physical Activity:   . Days of Exercise per Week:   . Minutes of Exercise per Session:   Stress:   . Feeling of Stress :   Social Connections:   . Frequency of Communication with Friends and Family:   . Frequency of Social Gatherings with Friends and Family:   . Attends Religious Services:   . Active Member of Clubs or Organizations:   . Attends Banker Meetings:   Marland Kitchen Marital Status:     No current facility-administered medications on file prior to encounter.   Current Outpatient Medications on File Prior to Encounter  Medication Sig Dispense Refill  . ALPRAZolam (XANAX) 0.5 MG tablet Take 0.5 mg by mouth as needed for anxiety.     Marland Kitchen escitalopram (LEXAPRO) 10 MG tablet Take 5 mg by mouth daily.     Marland Kitchen  gabapentin (NEURONTIN) 300 MG capsule Take 300 mg by mouth daily.     Marland Kitchen HUMIRA PEN 40 MG/0.4ML PNKT Inject 40 mg into the skin every 14 (fourteen) days.    Marland Kitchen LORazepam (ATIVAN) 1 MG tablet Take 1 mg by mouth as needed for anxiety.       Review of Systems: a complete, 10pt review of systems was completed with pertinent positives and negatives as documented in the HPI  Physical Exam: Vitals:   05/24/20 2115 05/24/20 2300  BP: (!) 145/87 (!) 156/97  Pulse: (!) 131 (!) 121  Resp: (!) 24 19  Temp:    SpO2: 94% 91%   Gen: A&Ox3, no distress  Eyes: lids and conjunctivae normal, no icterus. Pupils equally round and reactive to light.  Mouth: Edema and ecchymosis with a superficial linear laceration along the left side of the  tongue Neck: supple without mass or thyromegaly.  No midline tenderness, trachea is midline, no crepitus or hematoma Chest: respiratory effort is normal. No crepitus or tenderness on palpation of the chest. Breath sounds equal.  Cardiovascular: Heart rate 108, regular, normotensive, with palpable distal pulses, no pedal edema Gastrointestinal:soft, nondistended, nontender. No mass, hepatomegaly or splenomegaly. No hernia.  Pelvis is stable and nontender. Lymphatic: no lymphadenopathy in the neck or groin Muscoloskeletal: no clubbing or cyanosis of the fingers.  Strength is symmetrical throughout.  Range of motion of bilateral upper and lower extremities normal without pain, crepitation or contracture. Neuro: cranial nerves grossly intact.  Sensation intact to light touch diffusely. Psych: appropriate mood and affect, normal insight/judgment intact  Skin: warm and dry   CBC Latest Ref Rng & Units 05/24/2020 05/24/2020  WBC 4.0 - 10.5 K/uL - 6.0  Hemoglobin 13.0 - 17.0 g/dL 63.8 75.6  Hematocrit 39 - 52 % 45.0 43.4  Platelets 150 - 400 K/uL - 60(L)    CMP Latest Ref Rng & Units 05/24/2020 05/24/2020  Glucose 70 - 99 mg/dL 433(I) 951(O)  BUN 6 - 20 mg/dL 8 8  Creatinine 8.41 - 1.24 mg/dL 6.60 6.30  Sodium 160 - 145 mmol/L 134(L) 135  Potassium 3.5 - 5.1 mmol/L 4.0 4.1  Chloride 98 - 111 mmol/L 98 95(L)  CO2 22 - 32 mmol/L - 11(L)  Calcium 8.9 - 10.3 mg/dL - 9.3  Total Protein 6.5 - 8.1 g/dL - 7.2  Total Bilirubin 0.3 - 1.2 mg/dL - 0.9  Alkaline Phos 38 - 126 U/L - 65  AST 15 - 41 U/L - 211(H)  ALT 0 - 44 U/L - 145(H)    Lab Results  Component Value Date   INR 1.1 05/24/2020    Imaging: CT HEAD WO CONTRAST  Result Date: 05/24/2020 CLINICAL DATA:  Status post trauma with subsequent seizure. EXAM: CT HEAD WITHOUT CONTRAST TECHNIQUE: Contiguous axial images were obtained from the base of the skull through the vertex without intravenous contrast. COMPARISON:  None. FINDINGS: Brain: No  evidence of acute infarction, hemorrhage, hydrocephalus, extra-axial collection or mass lesion/mass effect. Vascular: No hyperdense vessel or unexpected calcification. Skull: Normal. Negative for fracture or focal lesion. Sinuses/Orbits: No acute finding. Other: There is mild to moderate severity left occipital scalp soft tissue swelling. IMPRESSION: 1. No acute intracranial abnormality. 2. Mild to moderate severity left occipital scalp soft tissue swelling. Electronically Signed   By: Aram Candela M.D.   On: 05/24/2020 21:52   CT CERVICAL SPINE WO CONTRAST  Result Date: 05/24/2020 CLINICAL DATA:  Status post fall. EXAM: CT CERVICAL SPINE WITHOUT CONTRAST TECHNIQUE:  Multidetector CT imaging of the cervical spine was performed without intravenous contrast. Multiplanar CT image reconstructions were also generated. COMPARISON:  None. FINDINGS: Alignment: Normal. Skull base and vertebrae: No acute fracture. No primary bone lesion or focal pathologic process. Soft tissues and spinal canal: No prevertebral fluid or swelling. No visible canal hematoma. Disc levels: Normal multilevel endplates are seen with normal multilevel intervertebral disc spaces. New level normal multilevel bilateral facet joints are noted. Upper chest: Negative. Other: It should be noted that the study is limited secondary to patient motion. IMPRESSION: No acute fracture or subluxation of the cervical spine. Electronically Signed   By: Aram Candela M.D.   On: 05/24/2020 21:55   DG Chest Port 1 View  Result Date: 05/24/2020 CLINICAL DATA:  Status post fall. EXAM: PORTABLE CHEST 1 VIEW COMPARISON:  None. FINDINGS: The heart size and mediastinal contours are within normal limits. Both lungs are clear. The visualized skeletal structures are unremarkable. IMPRESSION: No active disease. Electronically Signed   By: Aram Candela M.D.   On: 05/24/2020 21:14     A/P: 39 year old man who sustained a fall at work which was followed by a  4-minute seizure.  There is no acute intracranial injury.  He does not have a history of seizures.  No evidence of other injury from the fall on physical exam.  Really his only injury is to the tongue from the seizure.  Incidental findings of elevated AST and ALT, thrombocytopenia.  Recommend medical admission for further work-up of seizure etiology and above labs; continue to monitor the tongue swelling and ensure no threat to the airway.  Trauma surgery will continue to follow.         Phylliss Blakes, MD [facility], [place]  See AMION to contact appropriate on-call provider

## 2020-05-25 NOTE — ED Notes (Signed)
Dr Fredricka Bonine at  The bedside

## 2020-05-26 ENCOUNTER — Inpatient Hospital Stay (HOSPITAL_COMMUNITY)
Admission: EM | Admit: 2020-05-26 | Discharge: 2020-05-29 | DRG: 897 | Disposition: A | Discharge status: Home / Self Care | Payer: Commercial Managed Care - PPO | Attending: Family Medicine | Admitting: Family Medicine

## 2020-05-26 ENCOUNTER — Encounter (HOSPITAL_COMMUNITY): Payer: Self-pay | Admitting: Emergency Medicine

## 2020-05-26 ENCOUNTER — Other Ambulatory Visit: Payer: Self-pay

## 2020-05-26 ENCOUNTER — Inpatient Hospital Stay (HOSPITAL_COMMUNITY): Payer: Commercial Managed Care - PPO

## 2020-05-26 DIAGNOSIS — D696 Thrombocytopenia, unspecified: Secondary | ICD-10-CM

## 2020-05-26 DIAGNOSIS — I10 Essential (primary) hypertension: Secondary | ICD-10-CM | POA: Diagnosis present

## 2020-05-26 DIAGNOSIS — K219 Gastro-esophageal reflux disease without esophagitis: Secondary | ICD-10-CM | POA: Diagnosis present

## 2020-05-26 DIAGNOSIS — D6959 Other secondary thrombocytopenia: Secondary | ICD-10-CM | POA: Diagnosis present

## 2020-05-26 DIAGNOSIS — S0003XA Contusion of scalp, initial encounter: Secondary | ICD-10-CM | POA: Diagnosis present

## 2020-05-26 DIAGNOSIS — Y990 Civilian activity done for income or pay: Secondary | ICD-10-CM | POA: Diagnosis not present

## 2020-05-26 DIAGNOSIS — F410 Panic disorder [episodic paroxysmal anxiety] without agoraphobia: Secondary | ICD-10-CM | POA: Diagnosis present

## 2020-05-26 DIAGNOSIS — Z880 Allergy status to penicillin: Secondary | ICD-10-CM | POA: Diagnosis not present

## 2020-05-26 DIAGNOSIS — Z20822 Contact with and (suspected) exposure to covid-19: Secondary | ICD-10-CM | POA: Diagnosis present

## 2020-05-26 DIAGNOSIS — F1010 Alcohol abuse, uncomplicated: Secondary | ICD-10-CM | POA: Diagnosis present

## 2020-05-26 DIAGNOSIS — E872 Acidosis: Secondary | ICD-10-CM | POA: Diagnosis present

## 2020-05-26 DIAGNOSIS — S01512A Laceration without foreign body of oral cavity, initial encounter: Secondary | ICD-10-CM | POA: Diagnosis present

## 2020-05-26 DIAGNOSIS — F329 Major depressive disorder, single episode, unspecified: Secondary | ICD-10-CM | POA: Diagnosis present

## 2020-05-26 DIAGNOSIS — F10231 Alcohol dependence with withdrawal delirium: Secondary | ICD-10-CM | POA: Diagnosis present

## 2020-05-26 DIAGNOSIS — R569 Unspecified convulsions: Secondary | ICD-10-CM | POA: Diagnosis not present

## 2020-05-26 DIAGNOSIS — Z79899 Other long term (current) drug therapy: Secondary | ICD-10-CM

## 2020-05-26 DIAGNOSIS — G629 Polyneuropathy, unspecified: Secondary | ICD-10-CM | POA: Diagnosis not present

## 2020-05-26 DIAGNOSIS — F1020 Alcohol dependence, uncomplicated: Secondary | ICD-10-CM | POA: Diagnosis present

## 2020-05-26 DIAGNOSIS — G4089 Other seizures: Secondary | ICD-10-CM | POA: Diagnosis present

## 2020-05-26 DIAGNOSIS — G621 Alcoholic polyneuropathy: Secondary | ICD-10-CM | POA: Diagnosis present

## 2020-05-26 DIAGNOSIS — R7989 Other specified abnormal findings of blood chemistry: Secondary | ICD-10-CM

## 2020-05-26 DIAGNOSIS — F419 Anxiety disorder, unspecified: Secondary | ICD-10-CM | POA: Diagnosis present

## 2020-05-26 DIAGNOSIS — E876 Hypokalemia: Secondary | ICD-10-CM | POA: Diagnosis present

## 2020-05-26 DIAGNOSIS — F10931 Alcohol use, unspecified with withdrawal delirium: Secondary | ICD-10-CM | POA: Diagnosis present

## 2020-05-26 DIAGNOSIS — W010XXA Fall on same level from slipping, tripping and stumbling without subsequent striking against object, initial encounter: Secondary | ICD-10-CM | POA: Diagnosis present

## 2020-05-26 DIAGNOSIS — K7010 Alcoholic hepatitis without ascites: Secondary | ICD-10-CM | POA: Diagnosis present

## 2020-05-26 DIAGNOSIS — F10939 Alcohol use, unspecified with withdrawal, unspecified: Secondary | ICD-10-CM

## 2020-05-26 DIAGNOSIS — Z87898 Personal history of other specified conditions: Secondary | ICD-10-CM | POA: Diagnosis not present

## 2020-05-26 DIAGNOSIS — L4050 Arthropathic psoriasis, unspecified: Secondary | ICD-10-CM | POA: Diagnosis present

## 2020-05-26 DIAGNOSIS — F10239 Alcohol dependence with withdrawal, unspecified: Secondary | ICD-10-CM | POA: Diagnosis present

## 2020-05-26 DIAGNOSIS — F17210 Nicotine dependence, cigarettes, uncomplicated: Secondary | ICD-10-CM | POA: Diagnosis present

## 2020-05-26 LAB — URINALYSIS, ROUTINE W REFLEX MICROSCOPIC
Bacteria, UA: NONE SEEN
Bilirubin Urine: NEGATIVE
Glucose, UA: NEGATIVE mg/dL
Ketones, ur: 20 mg/dL — AB
Leukocytes,Ua: NEGATIVE
Nitrite: NEGATIVE
Protein, ur: 100 mg/dL — AB
Specific Gravity, Urine: 1.014 (ref 1.005–1.030)
pH: 7 (ref 5.0–8.0)

## 2020-05-26 LAB — SARS CORONAVIRUS 2 BY RT PCR (HOSPITAL ORDER, PERFORMED IN ~~LOC~~ HOSPITAL LAB): SARS Coronavirus 2: NEGATIVE

## 2020-05-26 LAB — CBC WITH DIFFERENTIAL/PLATELET
Abs Immature Granulocytes: 0.05 10*3/uL (ref 0.00–0.07)
Basophils Absolute: 0 10*3/uL (ref 0.0–0.1)
Basophils Relative: 0 %
Eosinophils Absolute: 0 10*3/uL (ref 0.0–0.5)
Eosinophils Relative: 0 %
HCT: 38.8 % — ABNORMAL LOW (ref 39.0–52.0)
Hemoglobin: 12.8 g/dL — ABNORMAL LOW (ref 13.0–17.0)
Immature Granulocytes: 0 %
Lymphocytes Relative: 5 %
Lymphs Abs: 0.6 10*3/uL — ABNORMAL LOW (ref 0.7–4.0)
MCH: 31.3 pg (ref 26.0–34.0)
MCHC: 33 g/dL (ref 30.0–36.0)
MCV: 94.9 fL (ref 80.0–100.0)
Monocytes Absolute: 1.2 10*3/uL — ABNORMAL HIGH (ref 0.1–1.0)
Monocytes Relative: 10 %
Neutro Abs: 10 10*3/uL — ABNORMAL HIGH (ref 1.7–7.7)
Neutrophils Relative %: 85 %
Platelets: 48 10*3/uL — ABNORMAL LOW (ref 150–400)
RBC: 4.09 MIL/uL — ABNORMAL LOW (ref 4.22–5.81)
RDW: 13.6 % (ref 11.5–15.5)
WBC: 11.8 10*3/uL — ABNORMAL HIGH (ref 4.0–10.5)
nRBC: 0 % (ref 0.0–0.2)

## 2020-05-26 LAB — MRSA PCR SCREENING: MRSA by PCR: NEGATIVE

## 2020-05-26 LAB — COMPREHENSIVE METABOLIC PANEL
ALT: 120 U/L — ABNORMAL HIGH (ref 0–44)
AST: 207 U/L — ABNORMAL HIGH (ref 15–41)
Albumin: 4.2 g/dL (ref 3.5–5.0)
Alkaline Phosphatase: 47 U/L (ref 38–126)
Anion gap: 13 (ref 5–15)
BUN: 9 mg/dL (ref 6–20)
CO2: 24 mmol/L (ref 22–32)
Calcium: 9.6 mg/dL (ref 8.9–10.3)
Chloride: 97 mmol/L — ABNORMAL LOW (ref 98–111)
Creatinine, Ser: 0.69 mg/dL (ref 0.61–1.24)
GFR calc Af Amer: >60 mL/min (ref >60)
GFR calc non Af Amer: >60 mL/min (ref >60)
Glucose, Bld: 112 mg/dL — ABNORMAL HIGH (ref 70–99)
Potassium: 3.3 mmol/L — ABNORMAL LOW (ref 3.5–5.1)
Sodium: 134 mmol/L — ABNORMAL LOW (ref 135–145)
Total Bilirubin: 1.3 mg/dL — ABNORMAL HIGH (ref 0.3–1.2)
Total Protein: 6.9 g/dL (ref 6.5–8.1)

## 2020-05-26 LAB — PHOSPHORUS: Phosphorus: 2.4 mg/dL — ABNORMAL LOW (ref 2.5–4.6)

## 2020-05-26 LAB — ETHANOL: Alcohol, Ethyl (B): <10 mg/dL (ref <10)

## 2020-05-26 LAB — MAGNESIUM: Magnesium: 1.7 mg/dL (ref 1.7–2.4)

## 2020-05-26 LAB — LACTIC ACID, PLASMA: Lactic Acid, Venous: 1.2 mmol/L (ref 0.5–1.9)

## 2020-05-26 MED ORDER — GABAPENTIN 300 MG PO CAPS
300.0000 mg | ORAL_CAPSULE | Freq: Every day | ORAL | Status: DC
  Administered 2020-05-27 – 2020-05-29 (×3): 300 mg via ORAL
  Filled 2020-05-26 (×3): qty 1

## 2020-05-26 MED ORDER — LORAZEPAM 2 MG/ML IJ SOLN: 0.0000 mg | Freq: Two times a day (BID) | INTRAMUSCULAR | Status: DC

## 2020-05-26 MED ORDER — LORAZEPAM 1 MG PO TABS
1.0000 mg | ORAL_TABLET | ORAL | Status: AC | PRN
  Administered 2020-05-26: 1 mg via ORAL

## 2020-05-26 MED ORDER — LORAZEPAM 1 MG PO TABS: 0.0000 mg | ORAL_TABLET | Freq: Two times a day (BID) | ORAL | Status: DC

## 2020-05-26 MED ORDER — LORAZEPAM 2 MG/ML IJ SOLN
2.0000 mg | Freq: Once | INTRAMUSCULAR | Status: AC
  Administered 2020-05-26: 2 mg via INTRAVENOUS
  Filled 2020-05-26: qty 1

## 2020-05-26 MED ORDER — LORAZEPAM 2 MG/ML IJ SOLN
0.0000 mg | Freq: Four times a day (QID) | INTRAMUSCULAR | Status: AC
  Administered 2020-05-26: 2 mg via INTRAVENOUS
  Filled 2020-05-26 (×2): qty 1

## 2020-05-26 MED ORDER — SODIUM CHLORIDE 0.9% FLUSH
3.0000 mL | Freq: Two times a day (BID) | INTRAVENOUS | Status: DC
  Administered 2020-05-26 – 2020-05-29 (×7): 3 mL via INTRAVENOUS

## 2020-05-26 MED ORDER — LORAZEPAM 2 MG/ML IJ SOLN: 1.0000 mg | INTRAMUSCULAR | Status: AC | PRN

## 2020-05-26 MED ORDER — THIAMINE HCL 100 MG/ML IJ SOLN
100.0000 mg | Freq: Every day | INTRAMUSCULAR | Status: DC
  Administered 2020-05-26: 100 mg via INTRAVENOUS
  Filled 2020-05-26: qty 2

## 2020-05-26 MED ORDER — THIAMINE HCL 100 MG PO TABS
100.0000 mg | ORAL_TABLET | Freq: Every day | ORAL | Status: DC
  Administered 2020-05-27 – 2020-05-29 (×3): 100 mg via ORAL
  Filled 2020-05-26 (×3): qty 1

## 2020-05-26 MED ORDER — ALBUTEROL SULFATE (2.5 MG/3ML) 0.083% IN NEBU: 2.5000 mg | INHALATION_SOLUTION | Freq: Four times a day (QID) | RESPIRATORY_TRACT | Status: DC | PRN

## 2020-05-26 MED ORDER — LORAZEPAM 1 MG PO TABS
0.0000 mg | ORAL_TABLET | Freq: Four times a day (QID) | ORAL | Status: AC
  Administered 2020-05-26 – 2020-05-27 (×4): 1 mg via ORAL
  Filled 2020-05-26 (×5): qty 1

## 2020-05-26 MED ORDER — ADULT MULTIVITAMIN W/MINERALS CH
1.0000 | ORAL_TABLET | Freq: Every day | ORAL | Status: DC
  Administered 2020-05-27 – 2020-05-29 (×3): 1 via ORAL
  Filled 2020-05-26 (×3): qty 1

## 2020-05-26 MED ORDER — FOLIC ACID 1 MG PO TABS
1.0000 mg | ORAL_TABLET | Freq: Every day | ORAL | Status: DC
  Administered 2020-05-27 – 2020-05-29 (×3): 1 mg via ORAL
  Filled 2020-05-26 (×3): qty 1

## 2020-05-26 MED ORDER — ESCITALOPRAM OXALATE 10 MG PO TABS
10.0000 mg | ORAL_TABLET | Freq: Every day | ORAL | Status: DC
  Administered 2020-05-27 – 2020-05-29 (×3): 10 mg via ORAL
  Filled 2020-05-26 (×3): qty 1

## 2020-05-26 MED ORDER — STERILE WATER FOR INJECTION IJ SOLN
INTRAMUSCULAR | Status: AC
  Filled 2020-05-26: qty 10

## 2020-05-26 MED ORDER — ZIPRASIDONE MESYLATE 20 MG IM SOLR
20.0000 mg | Freq: Once | INTRAMUSCULAR | Status: AC
  Administered 2020-05-26: 20 mg via INTRAMUSCULAR
  Filled 2020-05-26: qty 20

## 2020-05-26 NOTE — ED Notes (Signed)
Pt first Lactic Acid normal. 2nd to be discontinued.

## 2020-05-26 NOTE — Discharge Summary (Signed)
Notified by RN that patient leaving AMA. He was admitted early yesterday morning with seizure, thought to be related to alcohol and Xanax. Per RN, he had been agitated, refusing medications and vitals, pulled out his IV (allowed it to be replaced before removing that one also), and eventually decided to leave.   He was seen on his way off the unit and stopped briefly to discus his decision. He did not offer any specific concern or reason for leaving but said that "it's none of anyone's business." We reviewed the reason for ongoing hospitalization and the risk of leaving now which could be fatal and includes recurrent seizures, bleeding (he has thrombocytopenia and epistaxis), other complications of alcohol withdrawal. We spoke more in ED waiting area where he is calling his wife to pick him up; he is oriented x4, reports understanding the risk of leaving AMA, but continues to insist on leaving. Security in ED waiting area aware of situation.

## 2020-05-26 NOTE — H&P (Signed)
History and Physical    IRISH PIECH EGB:[id number] DOB: 01-29-1981 DOA: 05/26/2020  Referring MD/NP/PA: Placido Sou, PA-C PCP: Marden Noble, MD  Patient coming from: Home  Chief Complaint: Confusion  I have personally briefly reviewed patient's old medical records in [facility]   HPI: Joshua Wade is a 39 y.o. male with medical history significant of alcohol abuse with associated neuropathy, anxiety, and tobacco use presents after being brought in by his wife for confusion.  History is obtained from the patient's wife present at bedside as the patient had recently received Geodon and is lethargic at this time.  Patient has just recently been hospitalized from 7/9-7/10, after falling and hitting the back of his head while at work.  Coworkers had witnessed him having a tonic-clonic seizure lasting approximately 4 minutes from review of records.  He had been admitted to [facility] service for observation overnight.  However, left the hospital AGAINST MEDICAL ADVICE 2 AM this morning.  Since being at home his wife reports that he has been talking and acting strangely.  Patient was reporting that someone was taking him to the store and away from all of his friends.  Wife notes that he was alert and oriented, but not oriented all at the same time.  He had been having bleeding from his nose and ears thought related to his low platelet counts.  Normally he patient drinks a bottle of wine a day on average.  His last drink may have been 3-4 days ago, but she is not sure.    ED Course: Patient was seen to be afebrile pulse 10 2-1 16, respiration 18-28, blood pressure elevated up to 180/103, and O2 saturations transiently as low as 88% after receiving.  Noted to be diaphoretic and confused with unsteady gait.  Labs significant for WBC 11.8, hemoglobin 12.8, platelets 48, sodium 134, potassium 3.3, AST 207, ALT 120, and total bilirubin 1.3.  Patient had been started on CIWA protocol with Ativan,  thiamine, and ultimately given 20 mg of Geodon for agitation.  Review of Systems  Unable to perform ROS: Mental status change  HENT: Positive for nosebleeds.        Positive for ear bleeding  Psychiatric/Behavioral:       Positive for delusions    Past Medical History:  Diagnosis Date  . Acid reflux   . Alcohol use   . Anxiety   . Psoriatic arthritis [facility])     Past Surgical History:  Procedure Laterality Date  . NO PAST SURGERIES       reports that he has quit smoking. He has never used smokeless tobacco. He reports current alcohol use. He reports previous drug use. Drug: Marijuana.  Allergies  Allergen Reactions  . Penicillins     Hives.     Family History  Problem Relation Age of Onset  . Lupus Mother   . Colon cancer Mother   . ALS Father     Prior to Admission medications   Medication Sig Start Date End Date Taking? Authorizing Provider  Adalimumab (HUMIRA) 10 MG/0.1ML PSKT Inject into the skin every 14 (fourteen) days.   Yes [provider]  ALPRAZolam Prudy Feeler) 0.5 MG tablet Take 0.5 mg by mouth at bedtime as needed for anxiety.   Yes [provider]  escitalopram (LEXAPRO) 10 MG tablet Take 10 mg by mouth daily.   Yes [provider]  gabapentin (NEURONTIN) 300 MG capsule Take 1 capsule (300 mg total) by mouth 2 (two)  times daily. Patient taking differently: Take 300 mg by mouth daily.  03/18/20  Yes Patel, Donika K, DO  LORazepam (ATIVAN) 0.5 MG tablet Take 0.5 mg by mouth 2 (two) times daily. Patient not taking: Reported on 05/26/2020    [provider]  NON FORMULARY Apply 1 application topically daily. CBD Oil  Patient not taking: Reported on 05/26/2020    [provider]  pantoprazole (PROTONIX) 40 MG tablet Take 40 mg by mouth 3 times/day as needed-between meals & bedtime.  Patient not taking: Reported on 05/26/2020    [provider]    Physical Exam:  Constitutional: Middle-age male currently  lethargic Vitals:   05/26/20 0843 05/26/20 0858 05/26/20 0929 05/26/20 1002  BP:  (!) 152/99 (!) 148/107 (!) 158/96  Pulse: (!) 109 (!) 102 (!) 107 (!) 110  Resp:  (!) 21 (!) 23 (!) 21  Temp:      SpO2:  95% 92% (!) 88%   Eyes: PERRL, lids and conjunctivae normal ENMT: Mucous membranes are moist.  Bleeding behind the tympanic membranes. Neck: normal, supple, no masses, no thyromegaly Respiratory: clear to auscultation bilaterally, no wheezing, no crackles. Normal respiratory effort. No accessory muscle use.  Cardiovascular: Tachycardic, no murmurs / rubs / gallops. No extremity edema. 2+ pedal pulses. No carotid bruits.  Abdomen: no tenderness, no masses palpated. No hepatosplenomegaly. Bowel sounds positive.  Musculoskeletal: no clubbing / cyanosis. No joint deformity upper and lower extremities. Good ROM, no contractures. Normal muscle tone.  Skin: Hematoma noted of the occipital area of the head on the left side Neurologic: CN 2-12 grossly intact.  Patient appears to be able to move all extremities Psychiatric: Lethargic and unable to be assessed at this time.    Labs on Admission: I have personally reviewed following labs and imaging studies  CBC: Recent Labs  Lab 05/26/20 0839  WBC 11.8*  NEUTROABS 10.0*  HGB 12.8*  HCT 38.8*  MCV 94.9  PLT 48*   Basic Metabolic Panel: Recent Labs  Lab 05/26/20 0839  NA 134*  K 3.3*  CL 97*  CO2 24  GLUCOSE 112*  BUN 9  CREATININE 0.69  CALCIUM 9.6  MG 1.7   GFR: CrCl cannot be calculated (Unknown ideal weight.). Liver Function Tests: Recent Labs  Lab 05/26/20 0839  AST 207*  ALT 120*  ALKPHOS 47  BILITOT 1.3*  PROT 6.9  ALBUMIN 4.2   No results for input(s): LIPASE, AMYLASE in the last 168 hours. No results for input(s): AMMONIA in the last 168 hours. Coagulation Profile: No results for input(s): INR, PROTIME in the last 168 hours. Cardiac Enzymes: No results for input(s): CKTOTAL, CKMB, CKMBINDEX, TROPONINI in  the last 168 hours. BNP (last 3 results) No results for input(s): PROBNP in the last 8760 hours. HbA1C: No results for input(s): HGBA1C in the last 72 hours. CBG: No results for input(s): GLUCAP in the last 168 hours. Lipid Profile: No results for input(s): CHOL, HDL, LDLCALC, TRIG, CHOLHDL, LDLDIRECT in the last 72 hours. Thyroid Function Tests: No results for input(s): TSH, T4TOTAL, FREET4, T3FREE, THYROIDAB in the last 72 hours. Anemia Panel: No results for input(s): VITAMINB12, FOLATE, FERRITIN, TIBC, IRON, RETICCTPCT in the last 72 hours. Urine analysis: No results found for: COLORURINE, APPEARANCEUR, LABSPEC, PHURINE, GLUCOSEU, HGBUR, BILIRUBINUR, KETONESUR, PROTEINUR, UROBILINOGEN, NITRITE, LEUKOCYTESUR Sepsis Labs: No results found for this or any previous visit (from the past 240 hour(s)).   Radiological Exams on Admission: No results found.  EKG: Independently reviewed.  Sinus  tachycardia at 103 bpm  Assessment/Plan  Alcohol withdrawal delirium Alcohol abuse: Acute.  Patient wife notes that patient only drinks a bottle of wine per day on average, and is possibly 3-4 days out from his last drink his alcohol level was undetectable.  He was seen to be tachycardic, tremulous, and reported having a belief that someone was taking him away.  Suspecting patient in acute withdrawals with delirium. -Admit to a progressive bed -Recheck CT scan of the brain without contrast given thrombocytopenia -CIWA protocols with scheduled Ativan and as needed -Thiamine, multivitamin, and folate -Transitions of care consult   Thrombocytopenia: Platelet count 48 on admission and appears relatively unchanged from prior admission.  Patient reportedly continues to nose and ear bleeds. -Follow-up repeat CT scan  Alcohol related neuropathy: Home medications include gabapentin 300 mg grayness twice daily, but patient taking once daily. -Continue gabapentin  History of seizure: Seizure that occurred  prior to last admission thought possibly precipitated by trauma, but also question possibility of alcohol withdrawal.  Case had been discussed with neurology who had not recommended any antiepileptic drugs at that time.   -Patient would likely need to be recounseled on the need of no driving for 6 months.  Anxiety: Home medications include Lexapro 10 mg daily and Ativan 0.5 mg twice daily as needed for anxiety. -Continue Lexapro   Elevated liver enzymes Hyperbilirubinemia: Relatively unchanged from prior.  AST 207 and ALT 120.  AST to ALT ratio consistent with alcohol abuse   DVT prophylaxis: SCDs Code Status: Full  Family Communication: Plan of care discussed with patient's wife Disposition Plan: To be determined Consults called: None Admission status: inpatient  Clydie Braun MD [facility] Pager [phone]   If 7PM-7AM, please contact night-coverage [email or web address] Password [facility]  05/26/2020, 10:49 AM

## 2020-05-26 NOTE — Plan of Care (Signed)
  Problem: Health Behavior/Discharge Planning: Goal: Identification of resources available to assist in meeting health care needs will improve Outcome: Progressing   Problem: Physical Regulation: Goal: Complications related to the disease process, condition or treatment will be avoided or minimized Outcome: Progressing   Problem: Safety: Goal: Ability to remain free from injury will improve Outcome: Progressing   Problem: Nutrition: Goal: Adequate nutrition will be maintained Outcome: Progressing   Problem: Coping: Goal: Level of anxiety will decrease Outcome: Progressing   Problem: Clinical Measurements: Goal: Ability to maintain clinical measurements within normal limits will improve Outcome: Progressing   Problem: Pain Managment: Goal: General experience of comfort will improve Outcome: Progressing   Problem: Safety: Goal: Ability to remain free from injury will improve Outcome: Progressing   Problem: Skin Integrity: Goal: Risk for impaired skin integrity will decrease Outcome: Progressing

## 2020-05-26 NOTE — ED Triage Notes (Signed)
Pt and family member report he checked himself out from 3W.   He was admitted after he had a seizure, labs were "off'.  Pt appears very anxious, Family reports he is altered.

## 2020-05-26 NOTE — Progress Notes (Signed)
Pt became increasingly agitated over the course of the evening, developed a nosebleed, and left AMA at 0120. Dr. Antionette Char came to the unit and tried to dissuade pt from leaving, due to risk of seizure recurring. Pt was insistent on leaving. Dr. Antionette Char escorted pt off the unit.

## 2020-05-26 NOTE — ED Provider Notes (Signed)
[facility] EMERGENCY DEPARTMENT Provider Note   CSN: [id number] Arrival date & time: 05/26/20  0234     History Chief Complaint  Patient presents with   Seizures    Joshua Wade is a 39 y.o. male.  HPI   Patient is a 39 year old male with a medical history as noted below.  Patient takes Lexapro daily and Xanax as needed for anxiety.  Patient was admitted 2 days ago due to an epileptic seizure which was witnessed status post a fall.  Patient was found to have a anion gap metabolic acidosis, thrombocytopenia, elevated LFTs.  Patient was ultimately admitted to the hospitalist service.  While his wife was at home, patient left AMA yesterday.  Patient's wife brings him back to the emergency department today.  She states that Joshua Wade still appears confused, which she feels has worsened.  She does confirm his history of alcohol abuse.  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  She states Joshua Wade typically drinks 1 bottle of wine per night.  She states Joshua Wade was feeling unwell earlier in the week and believes his last alcohol consumption was about 5 days ago.     Past Medical History:  Diagnosis Date   Acid reflux    Alcohol use    Anxiety    Psoriatic arthritis [facility])     Patient Active Problem List   Diagnosis Date Noted   Neuropathy 05/05/2019    Past Surgical History:  Procedure Laterality Date   NO PAST SURGERIES        Family History  Problem Relation Age of Onset   Lupus Mother    Colon cancer Mother    ALS Father     Social History   Tobacco Use   Smoking status: Former Smoker   Smokeless tobacco: Never Used  Building services engineer Use: Never assessed  Substance Use Topics   Alcohol use: Yes   Drug use: Not Currently    Types: Marijuana    Comment: in the past    Home Medications Prior to Admission medications   Medication Sig Start Date End Date Taking? Authorizing Provider  Adalimumab  (HUMIRA) 10 MG/0.1ML PSKT Inject into the skin every 14 (fourteen) days.    [provider]  ALPRAZolam Prudy Feeler) 0.5 MG tablet Take 0.5 mg by mouth at bedtime as needed for anxiety.    [provider]  escitalopram (LEXAPRO) 10 MG tablet Take 10 mg by mouth daily.    [provider]  gabapentin (NEURONTIN) 300 MG capsule Take 1 capsule (300 mg total) by mouth 2 (two) times daily. 03/18/20   Patel, Donika K, DO  LORazepam (ATIVAN) 0.5 MG tablet Take 0.5 mg by mouth 2 (two) times daily.    [provider]  NON FORMULARY Apply 1 application topically daily. CBD Oil     [provider]  pantoprazole (PROTONIX) 40 MG tablet Take 40 mg by mouth 3 times/day as needed-between meals & bedtime.     [provider]    Allergies    Penicillins  Review of Systems   Review of Systems  Unable to perform ROS: Mental status change   Physical Exam Updated Vital Signs BP (!) 150/103    Pulse (!) 103    Temp 98.2 F (36.8 C)    Resp 18    SpO2 99%   Physical Exam Vitals and nursing note reviewed.  Constitutional:      General: Joshua Wade is  in acute distress.     Appearance: Normal appearance. Joshua Wade is ill-appearing. Joshua Wade is not toxic-appearing or diaphoretic.  HENT:     Head: Normocephalic and atraumatic.     Right Ear: External ear normal.     Left Ear: External ear normal.     Nose: Nose normal.     Mouth/Throat:     Mouth: Mucous membranes are moist.     Pharynx: Oropharynx is clear. No oropharyngeal exudate or posterior oropharyngeal erythema.  Eyes:     General: No scleral icterus.       Right eye: No discharge.        Left eye: No discharge.     Extraocular Movements: Extraocular movements intact.     Conjunctiva/sclera: Conjunctivae normal.     Pupils: Pupils are equal, round, and reactive to light.  Cardiovascular:     Rate and Rhythm: Regular rhythm. Tachycardia present.     Pulses: Normal pulses.     Heart sounds: Normal heart sounds. No murmur  heard.  No friction rub. No gallop.   Pulmonary:     Effort: Pulmonary effort is normal. No respiratory distress.     Breath sounds: Normal breath sounds. No stridor. No wheezing, rhonchi or rales.  Abdominal:     General: Abdomen is flat.     Palpations: Abdomen is soft.     Tenderness: There is no abdominal tenderness.  Musculoskeletal:        General: Normal range of motion.     Cervical back: Normal range of motion and neck supple. No tenderness.  Skin:    General: Skin is warm and dry.     Comments: Mildly diaphoretic  Neurological:     General: No focal deficit present.     Mental Status: Joshua Wade is alert.     Comments: Patient is oriented to person and time.  When asked about his current location Joshua Wade states that "Joshua Wade was on his way to the bank".  No arm drift.  Finger-to-nose intact bilaterally.  Moderate tremor noted with arms fully extended.  Strength is 5 out of 5 in all 4 extremities.    Pt ambulates with an unstable gait. Needed to be assisted by myself and additional nursing technician to ambulate to the bathroom.  Psychiatric:        Mood and Affect: Mood normal.        Behavior: Behavior normal.    ED Results / Procedures / Treatments   Labs (all labs ordered are listed, but only abnormal results are displayed) Labs Reviewed  COMPREHENSIVE METABOLIC PANEL - Abnormal; Notable for the following components:      Result Value   Sodium 134 (*)    Potassium 3.3 (*)    Chloride 97 (*)    Glucose, Bld 112 (*)    AST 207 (*)    ALT 120 (*)    Total Bilirubin 1.3 (*)    All other components within normal limits  CBC WITH DIFFERENTIAL/PLATELET - Abnormal; Notable for the following components:   WBC 11.8 (*)    RBC 4.09 (*)    Hemoglobin 12.8 (*)    HCT 38.8 (*)    Platelets 48 (*)    Neutro Abs 10.0 (*)    Lymphs Abs 0.6 (*)    Monocytes Absolute 1.2 (*)    All other components within normal limits  SARS CORONAVIRUS 2 BY RT PCR (HOSPITAL ORDER, PERFORMED IN [facility])  MAGNESIUM  ETHANOL  LACTIC  ACID, PLASMA  URINALYSIS, ROUTINE W REFLEX MICROSCOPIC  LACTIC ACID, PLASMA   EKG EKG Interpretation  Date/Time:  Sunday May 26 2020 08:40:31 EDT Ventricular Rate:  103 PR Interval:    QRS Duration: 95 QT Interval:  355 QTC Calculation: 465 R Axis:   63 Text Interpretation: Sinus tachycardia Probable left atrial enlargement Left ventricular hypertrophy Artifact in lead(s) I III aVR aVL aVF V1 Interpretation limited secondary to artifact Confirmed by Tilden Fossa [id number]) on 05/26/2020 8:55:40 AM   Radiology No results found.  Procedures Procedures (including critical care time)  Medications Ordered in ED Medications  LORazepam (ATIVAN) injection 0-4 mg (2 mg Intravenous Given 05/26/20 0848)    Or  LORazepam (ATIVAN) tablet 0-4 mg ( Oral See Alternative 05/26/20 0848)  LORazepam (ATIVAN) injection 0-4 mg (has no administration in time range)    Or  LORazepam (ATIVAN) tablet 0-4 mg (has no administration in time range)  thiamine tablet 100 mg (has no administration in time range)    Or  thiamine (B-1) injection 100 mg (has no administration in time range)  sterile water (preservative free) injection (has no administration in time range)  ziprasidone (GEODON) injection 20 mg (20 mg Intramuscular Given 05/26/20 1024)  LORazepam (ATIVAN) injection 2 mg (2 mg Intravenous Given 05/26/20 1024)   ED Course  I have reviewed the triage vital signs and the nursing notes.  Pertinent labs & imaging results that were available during my care of the patient were reviewed by me and considered in my medical decision making (see chart for details).  Clinical Course as of May 27 1055  Sun May 26, 2020  1046 Platelets(!): 48 [LJ]  1047 AST(!): 207 [LJ]  1047 ALT(!): 120 [LJ]  1047 Pulse Rate(!): 110 [LJ]  1047 Lactic acid, plasma [LJ]  1047 Alcohol, Ethyl (B): <10 [LJ]  1047 Anion gap: 13 [LJ]    Clinical Course User Index [LJ]  Placido Sou, PA-C   MDM Rules/Calculators/A&P                          Pt is a 39 y.o. male that present with a history, physical exam, ED Clinical Course as noted above.   Patient was recently admitted for seizure, thrombocytopenia, alcohol withdrawals.  Joshua Wade left AMA yesterday.  Joshua Wade represented today with his wife.  Patient still appears to be in acute alcohol withdrawal.  Diaphoretic, confused, has an unstable gait.  Patient was given Geodon IM as well as IV Ativan.  Joshua Wade is now sleeping comfortably.  Joshua Wade is mildly tachycardic around 105.  His wife is with him at bedside.  Will discuss with [facility] hospitalist for likely readmission.  Note: Portions of this report may have been transcribed using voice recognition software. Every effort was made to ensure accuracy; however, inadvertent computerized transcription errors may be present.    Final Clinical Impression(s) / ED Diagnoses Final diagnoses:  Alcohol withdrawal syndrome with complication (HCC)  Elevated LFTs  Thrombocytopenia [facility])   Rx / DC Orders ED Discharge Orders    None       Placido Sou, PA-C 05/26/20 1056    Tilden Fossa, MD 05/27/20 1301

## 2020-05-26 NOTE — Progress Notes (Signed)
HR was up to 140's at 1802 while he was having dinner. Patient's wife stated that he bit his tongue and Lt. Inside of cheek. Patient did several times valsalva maneuver. It helped little bit but staying 100's -120's. BP was 144/106. Administered ativan 1 mg po. Stopped eating now. HR staying 90's -110's. Notified Dr. Katrinka Blazing regarding this matter and treat with ativan, no other medications needed. Continue to monitor HR & BP. HS Sabra Heck

## 2020-05-27 ENCOUNTER — Inpatient Hospital Stay (HOSPITAL_COMMUNITY): Payer: Commercial Managed Care - PPO

## 2020-05-27 DIAGNOSIS — R569 Unspecified convulsions: Secondary | ICD-10-CM

## 2020-05-27 LAB — BASIC METABOLIC PANEL
Anion gap: 12 (ref 5–15)
BUN: 9 mg/dL (ref 6–20)
CO2: 24 mmol/L (ref 22–32)
Calcium: 9.4 mg/dL (ref 8.9–10.3)
Chloride: 102 mmol/L (ref 98–111)
Creatinine, Ser: 0.75 mg/dL (ref 0.61–1.24)
GFR calc Af Amer: >60 mL/min (ref >60)
GFR calc non Af Amer: >60 mL/min (ref >60)
Glucose, Bld: 99 mg/dL (ref 70–99)
Potassium: 3.1 mmol/L — ABNORMAL LOW (ref 3.5–5.1)
Sodium: 138 mmol/L (ref 135–145)

## 2020-05-27 LAB — CBC
HCT: 37.7 % — ABNORMAL LOW (ref 39.0–52.0)
Hemoglobin: 12.6 g/dL — ABNORMAL LOW (ref 13.0–17.0)
MCH: 31.7 pg (ref 26.0–34.0)
MCHC: 33.4 g/dL (ref 30.0–36.0)
MCV: 95 fL (ref 80.0–100.0)
Platelets: 50 10*3/uL — ABNORMAL LOW (ref 150–400)
RBC: 3.97 MIL/uL — ABNORMAL LOW (ref 4.22–5.81)
RDW: 13.4 % (ref 11.5–15.5)
WBC: 7.1 10*3/uL (ref 4.0–10.5)
nRBC: 0 % (ref 0.0–0.2)

## 2020-05-27 MED ORDER — POTASSIUM CHLORIDE CRYS ER 20 MEQ PO TBCR
40.0000 meq | EXTENDED_RELEASE_TABLET | ORAL | Status: AC
  Administered 2020-05-27 (×2): 40 meq via ORAL
  Filled 2020-05-27 (×2): qty 2

## 2020-05-27 MED ORDER — CLONIDINE HCL 0.1 MG PO TABS
0.1000 mg | ORAL_TABLET | Freq: Four times a day (QID) | ORAL | Status: DC | PRN
  Administered 2020-05-27 – 2020-05-28 (×2): 0.1 mg via ORAL
  Filled 2020-05-27 (×2): qty 1

## 2020-05-27 NOTE — Plan of Care (Signed)
Problem: Education: Goal: Knowledge of disease or condition will improve 05/27/2020 0732 by Luna Kitchens, RN Outcome: Progressing 05/27/2020 0731 by Luna Kitchens, RN Outcome: Progressing Goal: Understanding of discharge needs will improve 05/27/2020 0732 by Luna Kitchens, RN Outcome: Progressing 05/27/2020 0731 by Luna Kitchens, RN Outcome: Progressing   Problem: Health Behavior/Discharge Planning: Goal: Ability to identify changes in lifestyle to reduce recurrence of condition will improve 05/27/2020 0732 by Luna Kitchens, RN Outcome: Progressing 05/27/2020 0731 by Luna Kitchens, RN Outcome: Progressing Goal: Identification of resources available to assist in meeting health care needs will improve 05/27/2020 0732 by Luna Kitchens, RN Outcome: Progressing 05/27/2020 0731 by Luna Kitchens, RN Outcome: Progressing   Problem: Physical Regulation: Goal: Complications related to the disease process, condition or treatment will be avoided or minimized 05/27/2020 0732 by Luna Kitchens, RN Outcome: Progressing 05/27/2020 0731 by Luna Kitchens, RN Outcome: Progressing   Problem: Safety: Goal: Ability to remain free from injury will improve 05/27/2020 0732 by Luna Kitchens, RN Outcome: Progressing 05/27/2020 0731 by Luna Kitchens, RN Outcome: Progressing   Problem: Nutrition: Goal: Adequate nutrition will be maintained 05/27/2020 0732 by Luna Kitchens, RN Outcome: Progressing 05/27/2020 0731 by Luna Kitchens, RN Outcome: Progressing   Problem: Coping: Goal: Level of anxiety will decrease 05/27/2020 0732 by Luna Kitchens, RN Outcome: Progressing 05/27/2020 0731 by Luna Kitchens, RN Outcome: Progressing   Problem: Education: Goal: Knowledge of General Education information will improve Description: Including pain rating scale, medication(s)/side effects and non-pharmacologic comfort measures 05/27/2020 0732 by Luna Kitchens, RN Outcome: Progressing 05/27/2020 0731 by Luna Kitchens,  RN Outcome: Progressing   Problem: Health Behavior/Discharge Planning: Goal: Ability to manage health-related needs will improve 05/27/2020 0732 by Luna Kitchens, RN Outcome: Progressing 05/27/2020 0731 by Luna Kitchens, RN Outcome: Progressing   Problem: Clinical Measurements: Goal: Ability to maintain clinical measurements within normal limits will improve 05/27/2020 0732 by Luna Kitchens, RN Outcome: Progressing 05/27/2020 0731 by Luna Kitchens, RN Outcome: Progressing Goal: Will remain free from infection 05/27/2020 0732 by Luna Kitchens, RN Outcome: Progressing 05/27/2020 0731 by Luna Kitchens, RN Outcome: Progressing Goal: Diagnostic test results will improve 05/27/2020 0732 by Luna Kitchens, RN Outcome: Progressing 05/27/2020 0731 by Luna Kitchens, RN Outcome: Progressing Goal: Respiratory complications will improve 05/27/2020 0732 by Luna Kitchens, RN Outcome: Progressing 05/27/2020 0731 by Luna Kitchens, RN Outcome: Progressing Goal: Cardiovascular complication will be avoided 05/27/2020 0732 by Luna Kitchens, RN Outcome: Progressing 05/27/2020 0731 by Luna Kitchens, RN Outcome: Progressing   Problem: Activity: Goal: Risk for activity intolerance will decrease 05/27/2020 0732 by Luna Kitchens, RN Outcome: Progressing 05/27/2020 0731 by Luna Kitchens, RN Outcome: Progressing   Problem: Nutrition: Goal: Adequate nutrition will be maintained 05/27/2020 0732 by Luna Kitchens, RN Outcome: Progressing 05/27/2020 0731 by Luna Kitchens, RN Outcome: Progressing   Problem: Coping: Goal: Level of anxiety will decrease 05/27/2020 0732 by Luna Kitchens, RN Outcome: Progressing 05/27/2020 0731 by Luna Kitchens, RN Outcome: Progressing   Problem: Elimination: Goal: Will not experience complications related to bowel motility 05/27/2020 0732 by Luna Kitchens, RN Outcome: Progressing 05/27/2020 0731 by Luna Kitchens, RN Outcome: Progressing Goal: Will not experience complications  related to urinary retention 05/27/2020 0732 by Luna Kitchens, RN Outcome: Progressing 05/27/2020 0731 by Luna Kitchens, RN Outcome: Progressing   Problem: Pain Managment: Goal: General experience of comfort will improve 05/27/2020 0732 by Luna Kitchens, RN Outcome: Progressing 05/27/2020 0731 by Luna Kitchens, RN Outcome: Progressing   Problem: Safety: Goal: Ability to remain free from injury will improve 05/27/2020 0732 by Pennie Rushing,  Rosalita Chessman, RN Outcome: Progressing 05/27/2020 0731 by Luna Kitchens, RN Outcome: Progressing   Problem: Skin Integrity: Goal: Risk for impaired skin integrity will decrease 05/27/2020 0732 by Luna Kitchens, RN Outcome: Progressing 05/27/2020 0731 by Luna Kitchens, RN Outcome: Progressing

## 2020-05-27 NOTE — Progress Notes (Signed)
PROGRESS NOTE    Joshua FrayRobert C Demonte  ZOX:[id number] DOB: February 15, 1981 DOA: 05/26/2020 PCP: Marden NobleGates, Hashir, MD   Brief Narrative:  HPI: Joshua Wade is a 39 y.o. male with medical history significant of alcohol abuse with associated neuropathy, anxiety, and tobacco use presents after being brought in by his wife for confusion.  History is obtained from the patient's wife present at bedside as the patient had recently received Geodon and is lethargic at this time.  Patient has just recently been hospitalized from 7/9-7/10, after falling and hitting the back of his head while at work.  Coworkers had witnessed him having a tonic-clonic seizure lasting approximately 4 minutes from review of records.  [facility]  However, left the hospital AGAINST MEDICAL ADVICE 2 AM this morning.  Since being at home his wife reports that he has been talking and acting strangely.  Patient was reporting that someone was taking him to the store and away from all of his friends.  Wife notes that he was alert and oriented, but not oriented all at the same time.  He had been having bleeding from his nose and ears thought related to his low platelet counts.  Normally he patient drinks a bottle of wine a day on average.  His last drink may have been 3-4 days ago, but she is not sure.    ED Course: Patient was seen to be afebrile pulse 10 2-1 16, respiration 18-28, blood pressure elevated up to 180/103, and O2 saturations transiently as low as 88% after receiving.  Noted to be diaphoretic and confused with unsteady gait.  Labs significant for WBC 11.8, hemoglobin 12.8, platelets 48, sodium 134, potassium 3.3, AST 207, ALT 120, and total bilirubin 1.3.  Patient had been started on CIWA protocol with Ativan, thiamine, and ultimately given 20 mg of Geodon for agitation.  Assessment & Plan:   Active Problems:   Neuropathy   Alcohol withdrawal delirium (HCC)   Anxiety    Thrombocytopenia (HCC)   History of seizure   Alcohol abuse   Alcohol withdrawal delirium/alcohol withdrawal seizure/fall: Patient has been drinking more than 1 bottle of wine consistently on daily basis since more than 4 years.  He claims that his last drink was 10 days ago.  Not sure how much certainty to that statement is.  His CIWA in last 24 hours has been less than 10 and he has required only 1 dose of Ativan.  Currently he denies any symptoms but he still has fine tremors.  We will continue current CIWA protocol with as needed Ativan and multivitamins.  He and his mother at the bedside tells me that he had seizure-like activity few days ago at the restaurant and this was the first episode.  Apparently, he was admitted to the hospital but left AMA but for some reason, I cannot find any note from ED, admitting hospitalist or any discharge note.  No EEG was done.  I will order an EEG for him.  CT head was done due to history of thrombocytopenia and there was no evidence of hemorrhage or any other acute abnormality.  Thrombocytopenia: Platelets stable around 50.  This is likely alcohol induced.  Alcohol related neuropathy: Home medications include gabapentin 300 mg grayness twice daily, but patient taking once daily.  Anxiety: Continue home medications of Lexapro.  Alcoholic hepatitis: Slightly elevated LFTs, AST more than ALT consistent with alcoholic hepatitis.  Will check acute viral hepatitis panel.  Hypokalemia: 3.1.  Replace orally.  DVT prophylaxis: SCDs Start: 05/26/20 1054   Code Status: Full Code  Family Communication: Mother present at bedside.  Plan of care discussed verbalized understanding.  Status is: Inpatient  Remains inpatient appropriate because:Inpatient level of care appropriate due to severity of illness   Dispo: The patient is from: Home              Anticipated d/c is to: Home              Anticipated d/c date is: 1 day              Patient currently is not  medically stable to d/c.        Estimated body mass index is 24.37 kg/m as calculated from the following:   Height as of this encounter: 6\' 2"  (1.88 m).   Weight as of this encounter: 86.1 kg.      Nutritional status:               Consultants:   None  Procedures:   None  Antimicrobials:  Anti-infectives (From admission, onward)   None         Subjective: Patient seen and examined.  Mother at the bedside.  He has no complaints.  He seems motivated to quit.  Objective: Vitals:   05/27/20 0617 05/27/20 0824 05/27/20 1204 05/27/20 1205  BP: (!) 139/99 136/90  (!) 149/101  Pulse: (!) 110 (!) 107  98  Resp:  20  18  Temp:  98.8 F (37.1 C) 98.7 F (37.1 C)   TempSrc:  Oral Oral   SpO2:  93%  94%  Weight:      Height:        Intake/Output Summary (Last 24 hours) at 05/27/2020 1241 Last data filed at 05/27/2020 1139 Gross per 24 hour  Intake 846 ml  Output 650 ml  Net 196 ml   Filed Weights   05/26/20 1700  Weight: 86.1 kg    Examination:  General exam: Appears calm and comfortable, fine tremors in upper extremities Respiratory system: Clear to auscultation. Respiratory effort normal. Cardiovascular system: S1 & S2 heard, RRR. No JVD, murmurs, rubs, gallops or clicks. No pedal edema. Gastrointestinal system: Abdomen is nondistended, soft and nontender. No organomegaly or masses felt. Normal bowel sounds heard. Central nervous system: Alert and oriented. No focal neurological deficits. Extremities: Symmetric 5 x 5 power. Skin: No rashes, lesions or ulcers Psychiatry: Judgement and insight appear normal. Mood & affect appropriate.    Data Reviewed: I have personally reviewed following labs and imaging studies  CBC: Recent Labs  Lab 05/26/20 0839 05/27/20 0218  WBC 11.8* 7.1  NEUTROABS 10.0*  --   HGB 12.8* 12.6*  HCT 38.8* 37.7*  MCV 94.9 95.0  PLT 48* 50*   Basic Metabolic Panel: Recent Labs  Lab 05/26/20 0839 05/27/20 0218   NA 134* 138  K 3.3* 3.1*  CL 97* 102  CO2 24 24  GLUCOSE 112* 99  BUN 9 9  CREATININE 0.69 0.75  CALCIUM 9.6 9.4  MG 1.7  --   PHOS 2.4*  --    GFR: Estimated Creatinine Clearance: 145.6 mL/min (by C-G formula based on SCr of 0.75 mg/dL). Liver Function Tests: Recent Labs  Lab 05/26/20 0839  AST 207*  ALT 120*  ALKPHOS 47  BILITOT 1.3*  PROT 6.9  ALBUMIN 4.2   No results for input(s): LIPASE, AMYLASE in the last 168 hours. No results for input(s):  AMMONIA in the last 168 hours. Coagulation Profile: No results for input(s): INR, PROTIME in the last 168 hours. Cardiac Enzymes: No results for input(s): CKTOTAL, CKMB, CKMBINDEX, TROPONINI in the last 168 hours. BNP (last 3 results) No results for input(s): PROBNP in the last 8760 hours. HbA1C: No results for input(s): HGBA1C in the last 72 hours. CBG: No results for input(s): GLUCAP in the last 168 hours. Lipid Profile: No results for input(s): CHOL, HDL, LDLCALC, TRIG, CHOLHDL, LDLDIRECT in the last 72 hours. Thyroid Function Tests: No results for input(s): TSH, T4TOTAL, FREET4, T3FREE, THYROIDAB in the last 72 hours. Anemia Panel: No results for input(s): VITAMINB12, FOLATE, FERRITIN, TIBC, IRON, RETICCTPCT in the last 72 hours. Sepsis Labs: Recent Labs  Lab 05/26/20 0839  LATICACIDVEN 1.2    Recent Results (from the past 240 hour(s))  SARS Coronavirus 2 by RT PCR (hospital order, performed in [facility] hospital lab) Nasopharyngeal Nasopharyngeal Swab     Status: None   Collection Time: 05/26/20  1:58 PM   Specimen: Nasopharyngeal Swab  Result Value Ref Range Status   SARS Coronavirus 2 NEGATIVE NEGATIVE Final    Comment: (NOTE) SARS-CoV-2 target nucleic acids are NOT DETECTED.  The SARS-CoV-2 RNA is generally detectable in upper and lower respiratory specimens during the acute phase of infection. The lowest concentration of SARS-CoV-2 viral copies this assay can detect is 250 copies / mL. A negative  result does not preclude SARS-CoV-2 infection and should not be used as the sole basis for treatment or other patient management decisions.  A negative result may occur with improper specimen collection / handling, submission of specimen other than nasopharyngeal swab, presence of viral mutation(s) within the areas targeted by this assay, and inadequate number of viral copies (<250 copies / mL). A negative result must be combined with clinical observations, patient history, and epidemiological information.  Fact Sheet for Patients:   BoilerBrush.com.cy  Fact Sheet for Healthcare Providers: https://pope.com/  This test is not yet approved or  cleared by the [place] FDA and has been authorized for detection and/or diagnosis of SARS-CoV-2 by FDA under an Emergency Use Authorization (EUA).  This EUA will remain in effect (meaning this test can be used) for the duration of the COVID-19 declaration under Section 564(b)(1) of the Act, 21 U.S.C. section 360bbb-3(b)(1), unless the authorization is terminated or revoked sooner.  Performed at [facility], [address] [place], [place] [address]   MRSA PCR Screening     Status: None   Collection Time: 05/26/20  5:04 PM   Specimen: Nasal Mucosa; Nasopharyngeal  Result Value Ref Range Status   MRSA by PCR NEGATIVE NEGATIVE Final    Comment:        The GeneXpert MRSA Assay (FDA approved for NASAL specimens only), is one component of a comprehensive MRSA colonization surveillance program. It is not intended to diagnose MRSA infection nor to guide or monitor treatment for MRSA infections. Performed at [facility], [address] [place], [place] [address]       Radiology Studies: CT HEAD WO CONTRAST  Result Date: 05/26/2020 CLINICAL DATA:  Seizure EXAM: CT HEAD WITHOUT CONTRAST TECHNIQUE: Contiguous axial images were obtained from the base of the skull through the  vertex without intravenous contrast. COMPARISON:  May 24, 2020 FINDINGS: Brain: The ventricles and sulci are normal in size and configuration. There is no intracranial mass, hemorrhage, extra-axial fluid collection, or midline shift. The brain parenchyma appears unremarkable. No acute infarct  evident. Vascular: No hyperdense vessel.  No evident vascular calcification. Skull: Bony calvarium appears intact. There is again noted parietal scalp hematoma superiorly and posteriorly on both left and right sides. Sinuses/Orbits: There is mucosal thickening in several ethmoid air cells. Other visualized paranasal sinuses are clear. There is rightward deviation of the nasal septum. Orbits appear symmetric bilaterally. Other: Mastoid air cells are clear. IMPRESSION: Brain parenchyma appears unremarkable.  No mass or hemorrhage. Posterior parietal scalp hematoma again noted. Mucosal thickening in several ethmoid air cells noted. Deviated nasal septum. Electronically Signed   By: Bretta Bang III M.D.   On: 05/26/2020 12:57    Scheduled Meds: . escitalopram  10 mg Oral Daily  . folic acid  1 mg Oral Daily  . gabapentin  300 mg Oral Daily  . LORazepam  0-4 mg Intravenous Q6H   Or  . LORazepam  0-4 mg Oral Q6H  . [START ON 05/28/2020] LORazepam  0-4 mg Intravenous Q12H   Or  . [START ON 05/28/2020] LORazepam  0-4 mg Oral Q12H  . multivitamin with minerals  1 tablet Oral Daily  . potassium chloride  40 mEq Oral Q4H  . sodium chloride flush  3 mL Intravenous Q12H  . thiamine  100 mg Oral Daily   Or  . thiamine  100 mg Intravenous Daily   Continuous Infusions:   LOS: 1 day   Time spent: 33 minutes   Hughie Closs, MD [facility]  05/27/2020, 12:41 PM   To contact the attending provider between 7A-7P or the covering provider during after hours 7P-7A, please log into the web site [email or web address].

## 2020-05-27 NOTE — Procedures (Addendum)
Patient Name: SADARIUS NORMAN  MRN: [id number]  Epilepsy Attending: Charlsie Quest  Referring Physician/Provider: Dr Hughie Closs Date: 07/28/2020 Duration: 23.37 mins  Patient history: 39yo M with alcohol use disorder who had seizure like episode. EEG to evaluate for seizure.   Level of alertness: Awake  AEDs during EEG study: Gabapentin, Lorazepam  Technical aspects: This EEG study was done with scalp electrodes positioned according to the 10-20 International system of electrode placement. Electrical activity was acquired at a sampling rate of 500Hz  and reviewed with a high frequency filter of 70Hz  and a low frequency filter of 1Hz . EEG data were recorded continuously and digitally stored.   Description: The posterior dominant rhythm consists of 9 Hz activity of moderate voltage (25-35 uV) seen predominantly in posterior head regions, symmetric and reactive to eye opening and eye closing. Physiology photic driving was seen during photic stimulation.  Hyperventilation was not performed.     IMPRESSION: This study is within normal limits. No seizures or epileptiform discharges were seen throughout the recording.  Ricke Kimoto 

## 2020-05-27 NOTE — Progress Notes (Signed)
EEG Completed; Results Pending  

## 2020-05-27 NOTE — Progress Notes (Signed)
During handoff

## 2020-05-28 ENCOUNTER — Encounter (HOSPITAL_COMMUNITY): Payer: Self-pay | Admitting: Internal Medicine

## 2020-05-28 LAB — CBC WITH DIFFERENTIAL/PLATELET
Abs Immature Granulocytes: 0.03 10*3/uL (ref 0.00–0.07)
Basophils Absolute: 0 10*3/uL (ref 0.0–0.1)
Basophils Relative: 0 %
Eosinophils Absolute: 0 10*3/uL (ref 0.0–0.5)
Eosinophils Relative: 1 %
HCT: 40.2 % (ref 39.0–52.0)
Hemoglobin: 13.5 g/dL (ref 13.0–17.0)
Immature Granulocytes: 0 %
Lymphocytes Relative: 25 %
Lymphs Abs: 1.9 10*3/uL (ref 0.7–4.0)
MCH: 31.3 pg (ref 26.0–34.0)
MCHC: 33.6 g/dL (ref 30.0–36.0)
MCV: 93.3 fL (ref 80.0–100.0)
Monocytes Absolute: 0.9 10*3/uL (ref 0.1–1.0)
Monocytes Relative: 13 %
Neutro Abs: 4.6 10*3/uL (ref 1.7–7.7)
Neutrophils Relative %: 61 %
Platelets: 68 10*3/uL — ABNORMAL LOW (ref 150–400)
RBC: 4.31 MIL/uL (ref 4.22–5.81)
RDW: 13 % (ref 11.5–15.5)
WBC: 7.5 10*3/uL (ref 4.0–10.5)
nRBC: 0 % (ref 0.0–0.2)

## 2020-05-28 LAB — HEPATITIS PANEL, ACUTE
HCV Ab: NONREACTIVE
Hep A IgM: NONREACTIVE
Hep B C IgM: NONREACTIVE
Hepatitis B Surface Ag: NONREACTIVE

## 2020-05-28 LAB — COMPREHENSIVE METABOLIC PANEL
ALT: 95 U/L — ABNORMAL HIGH (ref 0–44)
AST: 111 U/L — ABNORMAL HIGH (ref 15–41)
Albumin: 3.9 g/dL (ref 3.5–5.0)
Alkaline Phosphatase: 45 U/L (ref 38–126)
Anion gap: 13 (ref 5–15)
BUN: 8 mg/dL (ref 6–20)
CO2: 25 mmol/L (ref 22–32)
Calcium: 9.7 mg/dL (ref 8.9–10.3)
Chloride: 100 mmol/L (ref 98–111)
Creatinine, Ser: 0.77 mg/dL (ref 0.61–1.24)
GFR calc Af Amer: >60 mL/min (ref >60)
GFR calc non Af Amer: >60 mL/min (ref >60)
Glucose, Bld: 95 mg/dL (ref 70–99)
Potassium: 3.8 mmol/L (ref 3.5–5.1)
Sodium: 138 mmol/L (ref 135–145)
Total Bilirubin: 1.2 mg/dL (ref 0.3–1.2)
Total Protein: 6.6 g/dL (ref 6.5–8.1)

## 2020-05-28 LAB — MAGNESIUM: Magnesium: 1.7 mg/dL (ref 1.7–2.4)

## 2020-05-28 MED ORDER — ACETAMINOPHEN 325 MG PO TABS
650.0000 mg | ORAL_TABLET | Freq: Four times a day (QID) | ORAL | Status: DC | PRN
  Administered 2020-05-28 (×2): 650 mg via ORAL
  Filled 2020-05-28 (×3): qty 2

## 2020-05-28 MED ORDER — METOPROLOL TARTRATE 25 MG PO TABS
25.0000 mg | ORAL_TABLET | Freq: Two times a day (BID) | ORAL | Status: DC
  Administered 2020-05-28 – 2020-05-29 (×3): 25 mg via ORAL
  Filled 2020-05-28 (×3): qty 1

## 2020-05-28 NOTE — Plan of Care (Signed)
  Problem: Education: Goal: Knowledge of disease or condition will improve Outcome: Progressing Goal: Understanding of discharge needs will improve Outcome: Progressing   Problem: Health Behavior/Discharge Planning: Goal: Ability to identify changes in lifestyle to reduce recurrence of condition will improve Outcome: Progressing Goal: Identification of resources available to assist in meeting health care needs will improve Outcome: Progressing   Problem: Physical Regulation: Goal: Complications related to the disease process, condition or treatment will be avoided or minimized Outcome: Progressing   Problem: Safety: Goal: Ability to remain free from injury will improve Outcome: Progressing   Problem: Nutrition: Goal: Adequate nutrition will be maintained Outcome: Progressing   Problem: Coping: Goal: Level of anxiety will decrease Outcome: Progressing   Problem: Education: Goal: Knowledge of General Education information will improve Description: Including pain rating scale, medication(s)/side effects and non-pharmacologic comfort measures Outcome: Progressing   Problem: Health Behavior/Discharge Planning: Goal: Ability to manage health-related needs will improve Outcome: Progressing   Problem: Activity: Goal: Risk for activity intolerance will decrease Outcome: Progressing   Problem: Nutrition: Goal: Adequate nutrition will be maintained Outcome: Progressing   Problem: Coping: Goal: Level of anxiety will decrease Outcome: Progressing   Problem: Elimination: Goal: Will not experience complications related to bowel motility Outcome: Progressing Goal: Will not experience complications related to urinary retention Outcome: Progressing   Problem: Pain Managment: Goal: General experience of comfort will improve Outcome: Progressing   Problem: Safety: Goal: Ability to remain free from injury will improve Outcome: Progressing   Problem: Skin Integrity: Goal:  Risk for impaired skin integrity will decrease Outcome: Progressing

## 2020-05-28 NOTE — TOC Initial Note (Addendum)
Transition of Care [facility]) - Initial/Assessment Note    Patient Details  Name: Joshua Wade MRN: [id number] Date of Birth: [birth year]  [facility] CM/SW Contact:    Benard Halsted, LCSW Phone Number: 05/28/2020, 12:09 PM  Clinical Narrative:                 11am-CSW received request to speak with patient's spouse regarding resources for ETOH cessation. CSW spoke with patient. He reported that he is at the hospital due to his alcohol use. He requests CSW contact his wife; he expressed that he wants to work things out with her and thinks it is a good idea for CSW to speak with her first as she is nervous for him to come home. CSW offered ETOH resources to the patient. He reports that he thinks he is "finished" with alcohol and has not used it for several days. He stated that he will contact CSW tomorrow if he wants the resources but that he is currently still processing.   CSW met with patient's spouse outside of the room. She expressed concern for patient coming straight home and starting to drink again, though she feels he may be able to stop for a little while. She recognizes that patient must be the one to make the decision to attend an ETOH program. CSW provided supportive listening and discussed healthy boundaries. She reported that she and his mother will speak with him and let CSW know if they require any additional assistance. Resources provided to patient's spouse.   Patient is currently still IVC'd. CSW will discuss with MD and rescind IVC if appropriate.   2pm-CSW received call from patient. He is requesting assistance with contacting [facility] for a referral. He is also interested in [facility]. CSW contacted [facility]; they have a waitling list but requested CSW send referral. [place] faxed referral for review. Patient to follow up with [facility] once he discharges home.   Expected Discharge Plan: Home/Self Care Barriers to Discharge: Continued Medical Work up   Patient Goals and CMS  Choice Patient states their goals for this hospitalization and ongoing recovery are:: Be sober CMS Medicare.gov Compare Post Acute Care list provided to:: Patient Choice offered to / list presented to : Patient  Expected Discharge Plan and Services Expected Discharge Plan: Home/Self Care In-house Referral: Clinical Social Work     Living arrangements for the past 2 months: Single Family Home                                      Prior Living Arrangements/Services Living arrangements for the past 2 months: Single Family Home Lives with:: Spouse Patient language and need for interpreter reviewed:: Yes Do you feel safe going back to the place where you live?: Yes      Need for Family Participation in Patient Care: No (Comment) Care giver support system in place?: Yes (comment)   Criminal Activity/Legal Involvement Pertinent to Current Situation/Hospitalization: No - Comment as needed  Activities of Daily Living Home Assistive Devices/Equipment: None ADL Screening (condition at time of admission) Patient's cognitive ability adequate to safely complete daily activities?: Yes Is the patient deaf or have difficulty hearing?: No Does the patient have difficulty seeing, even when wearing glasses/contacts?: No Does the patient have difficulty concentrating, remembering, or making decisions?: No Patient able to express need for assistance with ADLs?: Yes Does the patient have difficulty dressing  or bathing?: No Independently performs ADLs?: Yes (appropriate for developmental age) Does the patient have difficulty walking or climbing stairs?: No Weakness of Legs: None Weakness of Arms/Hands: None  Permission Sought/Granted Permission sought to share information with : Family Supports Permission granted to share information with : Yes, Verbal Permission Granted  Share Information with NAME: Luellen Pucker     Permission granted to share info w Relationship: Spouse  Permission granted to  share info w Contact Information: [phone]  Emotional Assessment Appearance:: Appears stated age Attitude/Demeanor/Rapport: Engaged Affect (typically observed): Accepting, Appropriate Orientation: : Oriented to Self, Oriented to Place, Oriented to  Time, Oriented to Situation Alcohol / Substance Use: Alcohol Use Psych Involvement: No (comment)  Admission diagnosis:  Alcohol withdrawal ([place]) [F10.239] Thrombocytopenia (HCC) [D69.6] Elevated LFTs [R79.89] Alcohol withdrawal syndrome with complication [facility]) [F27.614] Patient Active Problem List   Diagnosis Date Noted  . Alcohol withdrawal delirium ([place]) 05/26/2020  . Anxiety 05/26/2020  . Thrombocytopenia ([place]) 05/26/2020  . History of seizure 05/26/2020  . Alcohol abuse 05/26/2020  . Neuropathy 05/05/2019   PCP:  Josetta Huddle, MD Pharmacy:   [facility] [address] [place] - [place] AT [place] [address] [place] [address] Phone: [phone] Fax: [phone]     Social Determinants of Health (SDOH) Interventions    Readmission Risk Interventions No flowsheet data found.

## 2020-05-28 NOTE — Plan of Care (Signed)
  Problem: Education: Goal: Knowledge of disease or condition will improve Outcome: Progressing Goal: Understanding of discharge needs will improve Outcome: Progressing   Problem: Health Behavior/Discharge Planning: Goal: Ability to identify changes in lifestyle to reduce recurrence of condition will improve Outcome: Progressing Goal: Identification of resources available to assist in meeting health care needs will improve Outcome: Progressing   Problem: Physical Regulation: Goal: Complications related to the disease process, condition or treatment will be avoided or minimized Outcome: Progressing   Problem: Safety: Goal: Ability to remain free from injury will improve Outcome: Progressing   Problem: Nutrition: Goal: Adequate nutrition will be maintained Outcome: Progressing   Problem: Coping: Goal: Level of anxiety will decrease Outcome: Progressing   Problem: Education: Goal: Knowledge of General Education information will improve Description: Including pain rating scale, medication(s)/side effects and non-pharmacologic comfort measures Outcome: Progressing   Problem: Health Behavior/Discharge Planning: Goal: Ability to manage health-related needs will improve Outcome: Progressing   Problem: Clinical Measurements: Goal: Ability to maintain clinical measurements within normal limits will improve Outcome: Progressing Goal: Will remain free from infection Outcome: Progressing Goal: Diagnostic test results will improve Outcome: Progressing Goal: Respiratory complications will improve Outcome: Progressing Goal: Cardiovascular complication will be avoided Outcome: Progressing   Problem: Activity: Goal: Risk for activity intolerance will decrease Outcome: Progressing   Problem: Nutrition: Goal: Adequate nutrition will be maintained Outcome: Progressing   Problem: Coping: Goal: Level of anxiety will decrease Outcome: Progressing   Problem: Elimination: Goal:  Will not experience complications related to bowel motility Outcome: Progressing Goal: Will not experience complications related to urinary retention Outcome: Progressing   Problem: Pain Managment: Goal: General experience of comfort will improve Outcome: Progressing   Problem: Safety: Goal: Ability to remain free from injury will improve Outcome: Progressing   Problem: Skin Integrity: Goal: Risk for impaired skin integrity will decrease Outcome: Progressing   

## 2020-05-28 NOTE — Progress Notes (Signed)
PROGRESS NOTE    Joshua Wade  DJS:[id number] DOB: Sep 10, 1981 DOA: 05/26/2020 PCP: Marden Noble, MD   Brief Narrative:  Joshua Wade is a 39 y.o. male with medical history significant of alcohol abuse with associated neuropathy, anxiety, and tobacco use presents after being brought in by his wife for confusion. Patient has just recently been hospitalized from 7/9-7/10, after falling and hitting the back of his head while at work.  Coworkers had witnessed him having a tonic-clonic seizure lasting approximately 4 minutes from review of records.  He had been admitted to [facility] service for observation overnight.  However, left the hospital AGAINST MEDICAL ADVICE 2 AM this morning.  Since being at home his wife reports that he has been talking and acting strangely and was confused. Normally he patient drinks a bottle of wine a day on average.  His last drink may have been around 05/20/2020  Upon arrival to ED, patient was seen to be afebrile pulse 10 2-1 16, respiration 18-28, blood pressure elevated up to 180/103, and O2 saturations transiently as low as 88% after receiving.  Noted to be diaphoretic and confused with unsteady gait.  Labs significant for WBC 11.8, hemoglobin 12.8, platelets 48, sodium 134, potassium 3.3, AST 207, ALT 120, and total bilirubin 1.3.  Patient had been started on CIWA protocol with Ativan, thiamine, and ultimately given 20 mg of Geodon for agitation.  Patient was then admitted under hospitalist service for alcohol withdrawal.  Assessment & Plan:   Active Problems:   Neuropathy   Alcohol withdrawal delirium (HCC)   Anxiety   Thrombocytopenia (HCC)   History of seizure   Alcohol abuse   Alcohol withdrawal delirium/alcohol withdrawal seizure/fall: Patient has been drinking more than 1 bottle of wine consistently on daily basis since more than 4 years.  He claims that his last drink was 10 days ago.  Not sure how much certainty to that statement is.  His CIWA in last  24 hours has been less than 5 and he has required only 1 dose of Ativan.  Currently he denies any symptoms but he still has fine tremors which have improved compared to yesterday.  He does not feel comfortable going home.  TOC on board and provided information regarding alcohol withdrawal resources.  Seizure-like activity: EEG unremarkable for any seizure activity.  CT head negative.  His seizure was likely alcohol withdrawal seizure.  Thrombocytopenia: Improving.  This is likely alcohol induced.  Alcohol related neuropathy: Home medications include gabapentin 300 mg grayness twice daily, but patient taking once daily.  Anxiety: Continue home medications of Lexapro.  Alcoholic hepatitis: Slightly elevated LFTs, AST more than ALT consistent with alcoholic hepatitis.  Will check acute viral hepatitis panel.  Hypokalemia: Resolved.  Tachycardia/hypertension: Patient denies previous history of hypertension.  Remains tachycardic but denies any anxiety.  Mostly diastolic elevated.  Has required clonidine as needed in the hospital.  Will start on Lopressor 25 mg twice daily.  DVT prophylaxis: SCDs Start: 05/26/20 1054   Code Status: Full Code  Family Communication: Wife at bedside.  Plan of care discussed and they verbalized understanding.  Status is: Inpatient  Remains inpatient appropriate because:Inpatient level of care appropriate due to severity of illness   Dispo: The patient is from: Home              Anticipated d/c is to: Home              Anticipated d/c date is: 1 day  Patient currently is medically stable to d/c.        Estimated body mass index is 24.37 kg/m as calculated from the following:   Height as of this encounter: 6\' 2"  (1.88 m).   Weight as of this encounter: 86.1 kg.      Nutritional status:               Consultants:   None  Procedures:   None  Antimicrobials:  Anti-infectives (From admission, onward)   None          Subjective: Patient seen and examined.  Wife at the bedside.  He feels much better.  Denies any headache, dizziness or any other complaint.  Objective: Vitals:   05/28/20 0737 05/28/20 1029 05/28/20 1100 05/28/20 1222  BP: (!) 145/109 (!) 133/107 (!) 132/106 (!) 132/106  Pulse: (!) 106 (!) 136    Resp: 18 19 20 19   Temp: 98.3 F (36.8 C)     TempSrc: Oral     SpO2: 94%     Weight:      Height:        Intake/Output Summary (Last 24 hours) at 05/28/2020 1240 Last data filed at 05/28/2020 [id number] Gross per 24 hour  Intake 123 ml  Output 300 ml  Net -177 ml   Filed Weights   05/26/20 1700  Weight: 86.1 kg    Examination:  General exam: Appears calm and comfortable with very minimal fine tremors upper extremities Respiratory system: Clear to auscultation. Respiratory effort normal. Cardiovascular system: S1 & S2 heard, RRR. No JVD, murmurs, rubs, gallops or clicks. No pedal edema. Gastrointestinal system: Abdomen is nondistended, soft and nontender. No organomegaly or masses felt. Normal bowel sounds heard. Central nervous system: Alert and oriented. No focal neurological deficits. Extremities: Symmetric 5 x 5 power. Skin: No rashes, lesions or ulcers.  Psychiatry: Judgement and insight appear normal. Mood & affect appropriate.    Data Reviewed: I have personally reviewed following labs and imaging studies  CBC: Recent Labs  Lab 05/26/20 0839 05/27/20 0218 05/28/20 0226  WBC 11.8* 7.1 7.5  NEUTROABS 10.0*  --  4.6  HGB 12.8* 12.6* 13.5  HCT 38.8* 37.7* 40.2  MCV 94.9 95.0 93.3  PLT 48* 50* 68*   Basic Metabolic Panel: Recent Labs  Lab 05/26/20 0839 05/27/20 0218 05/28/20 0226  NA 134* 138 138  K 3.3* 3.1* 3.8  CL 97* 102 100  CO2 24 24 25   GLUCOSE 112* 99 95  BUN 9 9 8   CREATININE 0.69 0.75 0.77  CALCIUM 9.6 9.4 9.7  MG 1.7  --  1.7  PHOS 2.4*  --   --    GFR: Estimated Creatinine Clearance: 145.6 mL/min (by C-G formula based on SCr of 0.77  mg/dL). Liver Function Tests: Recent Labs  Lab 05/26/20 0839 05/28/20 0226  AST 207* 111*  ALT 120* 95*  ALKPHOS 47 45  BILITOT 1.3* 1.2  PROT 6.9 6.6  ALBUMIN 4.2 3.9   No results for input(s): LIPASE, AMYLASE in the last 168 hours. No results for input(s): AMMONIA in the last 168 hours. Coagulation Profile: No results for input(s): INR, PROTIME in the last 168 hours. Cardiac Enzymes: No results for input(s): CKTOTAL, CKMB, CKMBINDEX, TROPONINI in the last 168 hours. BNP (last 3 results) No results for input(s): PROBNP in the last 8760 hours. HbA1C: No results for input(s): HGBA1C in the last 72 hours. CBG: No results for input(s): GLUCAP in the last 168 hours. Lipid Profile: No results  for input(s): CHOL, HDL, LDLCALC, TRIG, CHOLHDL, LDLDIRECT in the last 72 hours. Thyroid Function Tests: No results for input(s): TSH, T4TOTAL, FREET4, T3FREE, THYROIDAB in the last 72 hours. Anemia Panel: No results for input(s): VITAMINB12, FOLATE, FERRITIN, TIBC, IRON, RETICCTPCT in the last 72 hours. Sepsis Labs: Recent Labs  Lab 05/26/20 0839  LATICACIDVEN 1.2    Recent Results (from the past 240 hour(s))  SARS Coronavirus 2 by RT PCR (hospital order, performed in [facility] hospital lab) Nasopharyngeal Nasopharyngeal Swab     Status: None   Collection Time: 05/26/20  1:58 PM   Specimen: Nasopharyngeal Swab  Result Value Ref Range Status   SARS Coronavirus 2 NEGATIVE NEGATIVE Final    Comment: (NOTE) SARS-CoV-2 target nucleic acids are NOT DETECTED.  The SARS-CoV-2 RNA is generally detectable in upper and lower respiratory specimens during the acute phase of infection. The lowest concentration of SARS-CoV-2 viral copies this assay can detect is 250 copies / mL. A negative result does not preclude SARS-CoV-2 infection and should not be used as the sole basis for treatment or other patient management decisions.  A negative result may occur with improper specimen collection /  handling, submission of specimen other than nasopharyngeal swab, presence of viral mutation(s) within the areas targeted by this assay, and inadequate number of viral copies (<250 copies / mL). A negative result must be combined with clinical observations, patient history, and epidemiological information.  Fact Sheet for Patients:   BoilerBrush.com.cy  Fact Sheet for Healthcare Providers: https://pope.com/  This test is not yet approved or  cleared by the [place] FDA and has been authorized for detection and/or diagnosis of SARS-CoV-2 by FDA under an Emergency Use Authorization (EUA).  This EUA will remain in effect (meaning this test can be used) for the duration of the COVID-19 declaration under Section 564(b)(1) of the Act, 21 U.S.C. section 360bbb-3(b)(1), unless the authorization is terminated or revoked sooner.  Performed at [facility], [address], [place], [place] [address]   MRSA PCR Screening     Status: None   Collection Time: 05/26/20  5:04 PM   Specimen: Nasal Mucosa; Nasopharyngeal  Result Value Ref Range Status   MRSA by PCR NEGATIVE NEGATIVE Final    Comment:        The GeneXpert MRSA Assay (FDA approved for NASAL specimens only), is one component of a comprehensive MRSA colonization surveillance program. It is not intended to diagnose MRSA infection nor to guide or monitor treatment for MRSA infections. Performed at [facility], [address], [place], [place] [address]       Radiology Studies: EEG adult  Result Date: 06-18-20 Charlsie Quest, MD     18-Jun-2020  5:43 PM Patient Name: SEMAJE KINKER MRN: [id number] Epilepsy Attending: Charlsie Quest Referring Physician/Provider: Dr Hughie Closs Date: 07/28/2020 Duration: 23.37 mins Patient history: 39yo M with alcohol use disorder who had seizure like episode. EEG to evaluate for seizure. Level of alertness: Awake AEDs during EEG  study: Gabapentin, Lorazepam Technical aspects: This EEG study was done with scalp electrodes positioned according to the 10-20 International system of electrode placement. Electrical activity was acquired at a sampling rate of 500Hz  and reviewed with a high frequency filter of 70Hz  and a low frequency filter of 1Hz . EEG data were recorded continuously and digitally stored. Description: The posterior dominant rhythm consists of 9 Hz activity of moderate voltage (25-35 uV) seen predominantly in posterior head regions, symmetric and reactive  to eye opening and eye closing. Physiology photic driving was seen during photic stimulation.  Hyperventilation was not performed.   IMPRESSION: This study is within normal limits. No seizures or epileptiform discharges were seen throughout the recording. Priyanka O Yadav    Scheduled Meds: . escitalopram  10 mg Oral Daily  . folic acid  1 mg Oral Daily  . gabapentin  300 mg Oral Daily  . LORazepam  0-4 mg Intravenous Q12H   Or  . LORazepam  0-4 mg Oral Q12H  . metoprolol tartrate  25 mg Oral BID  . multivitamin with minerals  1 tablet Oral Daily  . sodium chloride flush  3 mL Intravenous Q12H  . thiamine  100 mg Oral Daily   Or  . thiamine  100 mg Intravenous Daily   Continuous Infusions:   LOS: 2 days   Time spent: 30 minutes   Hughie Closs, MD [facility]  05/28/2020, 12:40 PM   To contact the attending provider between 7A-7P or the covering provider during after hours 7P-7A, please log into the web site [email or web address].

## 2020-05-29 ENCOUNTER — Encounter (HOSPITAL_COMMUNITY): Payer: Self-pay | Admitting: Internal Medicine

## 2020-05-29 DIAGNOSIS — I10 Essential (primary) hypertension: Secondary | ICD-10-CM | POA: Diagnosis present

## 2020-05-29 LAB — COMPREHENSIVE METABOLIC PANEL
ALT: 87 U/L — ABNORMAL HIGH (ref 0–44)
AST: 75 U/L — ABNORMAL HIGH (ref 15–41)
Albumin: 3.8 g/dL (ref 3.5–5.0)
Alkaline Phosphatase: 45 U/L (ref 38–126)
Anion gap: 12 (ref 5–15)
BUN: 10 mg/dL (ref 6–20)
CO2: 25 mmol/L (ref 22–32)
Calcium: 9.6 mg/dL (ref 8.9–10.3)
Chloride: 100 mmol/L (ref 98–111)
Creatinine, Ser: 0.7 mg/dL (ref 0.61–1.24)
GFR calc Af Amer: >60 mL/min (ref >60)
GFR calc non Af Amer: >60 mL/min (ref >60)
Glucose, Bld: 103 mg/dL — ABNORMAL HIGH (ref 70–99)
Potassium: 3.5 mmol/L (ref 3.5–5.1)
Sodium: 137 mmol/L (ref 135–145)
Total Bilirubin: 0.9 mg/dL (ref 0.3–1.2)
Total Protein: 7 g/dL (ref 6.5–8.1)

## 2020-05-29 LAB — CBC WITH DIFFERENTIAL/PLATELET
Abs Immature Granulocytes: 0.03 10*3/uL (ref 0.00–0.07)
Basophils Absolute: 0 10*3/uL (ref 0.0–0.1)
Basophils Relative: 0 %
Eosinophils Absolute: 0.1 10*3/uL (ref 0.0–0.5)
Eosinophils Relative: 1 %
HCT: 41.1 % (ref 39.0–52.0)
Hemoglobin: 14 g/dL (ref 13.0–17.0)
Immature Granulocytes: 0 %
Lymphocytes Relative: 21 %
Lymphs Abs: 1.4 10*3/uL (ref 0.7–4.0)
MCH: 31.8 pg (ref 26.0–34.0)
MCHC: 34.1 g/dL (ref 30.0–36.0)
MCV: 93.4 fL (ref 80.0–100.0)
Monocytes Absolute: 1 10*3/uL (ref 0.1–1.0)
Monocytes Relative: 15 %
Neutro Abs: 4.2 10*3/uL (ref 1.7–7.7)
Neutrophils Relative %: 63 %
Platelets: 103 10*3/uL — ABNORMAL LOW (ref 150–400)
RBC: 4.4 MIL/uL (ref 4.22–5.81)
RDW: 13.1 % (ref 11.5–15.5)
WBC: 6.7 10*3/uL (ref 4.0–10.5)
nRBC: 0 % (ref 0.0–0.2)

## 2020-05-29 MED ORDER — AMLODIPINE BESYLATE 5 MG PO TABS
5.0000 mg | ORAL_TABLET | Freq: Every day | ORAL | Status: DC
  Administered 2020-05-29: 5 mg via ORAL
  Filled 2020-05-29: qty 1

## 2020-05-29 MED ORDER — AMLODIPINE BESYLATE 5 MG PO TABS: 5.0000 mg | ORAL_TABLET | Freq: Every day | ORAL | 0 refills | Status: AC

## 2020-05-29 MED ORDER — METOPROLOL TARTRATE 25 MG PO TABS: 25.0000 mg | ORAL_TABLET | Freq: Two times a day (BID) | ORAL | 0 refills | Status: AC

## 2020-05-29 NOTE — Plan of Care (Signed)
  Problem: Education: Goal: Knowledge of disease or condition will improve Outcome: Progressing Goal: Understanding of discharge needs will improve Outcome: Progressing   Problem: Health Behavior/Discharge Planning: Goal: Ability to identify changes in lifestyle to reduce recurrence of condition will improve Outcome: Progressing Goal: Identification of resources available to assist in meeting health care needs will improve Outcome: Progressing   Problem: Physical Regulation: Goal: Complications related to the disease process, condition or treatment will be avoided or minimized Outcome: Progressing   Problem: Safety: Goal: Ability to remain free from injury will improve Outcome: Progressing   Problem: Nutrition: Goal: Adequate nutrition will be maintained Outcome: Progressing   Problem: Coping: Goal: Level of anxiety will decrease Outcome: Progressing   Problem: Education: Goal: Knowledge of General Education information will improve Description: Including pain rating scale, medication(s)/side effects and non-pharmacologic comfort measures Outcome: Progressing   Problem: Health Behavior/Discharge Planning: Goal: Ability to manage health-related needs will improve Outcome: Progressing   Problem: Clinical Measurements: Goal: Ability to maintain clinical measurements within normal limits will improve Outcome: Progressing Goal: Will remain free from infection Outcome: Progressing Goal: Diagnostic test results will improve Outcome: Progressing Goal: Respiratory complications will improve Outcome: Progressing Goal: Cardiovascular complication will be avoided Outcome: Progressing   Problem: Activity: Goal: Risk for activity intolerance will decrease Outcome: Progressing   Problem: Nutrition: Goal: Adequate nutrition will be maintained Outcome: Progressing   Problem: Coping: Goal: Level of anxiety will decrease Outcome: Progressing   Problem: Elimination: Goal:  Will not experience complications related to bowel motility Outcome: Progressing Goal: Will not experience complications related to urinary retention Outcome: Progressing   Problem: Pain Managment: Goal: General experience of comfort will improve Outcome: Progressing   Problem: Safety: Goal: Ability to remain free from injury will improve Outcome: Progressing   Problem: Skin Integrity: Goal: Risk for impaired skin integrity will decrease Outcome: Progressing

## 2020-05-29 NOTE — TOC Transition Note (Signed)
Transition of Care [facility]) - CM/SW Discharge Note   Patient Details  Name: Joshua Wade MRN: [id number] Date of Birth: 1981/05/05  [facility] of [facility]) CM/SW Contact:  Mearl Latin, LCSW Phone Number: 05/29/2020, 1:23 PM   Clinical Narrative:    CSW faxed signed Commitment Change paperwork to magistrate and placed on chart. No other needs identified.    Final next level of care: Home/Self Care Barriers to Discharge: Continued Medical Work up   Patient Goals and CMS Choice Patient states their goals for this hospitalization and ongoing recovery are:: Be sober CMS Medicare.gov Compare Post Acute Care list provided to:: Patient Choice offered to / list presented to : Patient  Discharge Placement                       Discharge Plan and Services In-house Referral: Clinical Social Work                                   Social Determinants of Health (SDOH) Interventions     Readmission Risk Interventions No flowsheet data found.

## 2020-05-29 NOTE — Discharge Instructions (Signed)
Alcohol Withdrawal Syndrome When a person who drinks a lot of alcohol stops drinking, he or she may have unpleasant and serious symptoms. These symptoms are called alcohol withdrawal syndrome. This condition may be mild or severe. It can be life-threatening. It can cause:  Shaking that you cannot control (tremor).  Sweating.  Headache.  Feeling fearful, upset, grouchy, or depressed.  Trouble sleeping (insomnia).  Nightmares.  Fast or uneven heartbeats (palpitations).  Alcohol cravings.  Feeling sick to your stomach (nausea).  Throwing up (vomiting).  Being bothered by light and sounds.  Confusion.  Trouble thinking clearly.  Not being hungry (loss of appetite).  Big changes in mood (mood swings). If you have all of the following symptoms at the same time, get help right away:  High blood pressure.  Fast heartbeat.  Trouble breathing.  Seizures.  Seeing, hearing, feeling, smelling, or tasting things that are not there (hallucinations). These symptoms are known as delirium tremens (DTs). They must be treated at the hospital right away. Follow these instructions at home:   Take over-the-counter and prescription medicines only as told by your doctor. This includes vitamins.  Do not drink alcohol.  Do not drive until your doctor says that this is safe for you.  Have someone stay with you or be available in case you need help. This should be someone you trust. This person can help you with your symptoms. He or she can also help you to not drink.  Drink enough fluid to keep your pee (urine) pale yellow.  Think about joining a support group or a treatment program to help you stop drinking.  Keep all follow-up visits as told by your doctor. This is important. Contact a doctor if:  Your symptoms get worse.  You cannot eat or drink without throwing up.  You have a hard time not drinking alcohol.  You cannot stop drinking alcohol. Get help right away  if:  You have fast or uneven heartbeats.  You have chest pain.  You have trouble breathing.  You have a seizure for the first time.  You see, hear, feel, smell, or taste something that is not there.  You get very confused. Summary  When a person who drinks a lot of alcohol stops drinking, he or she may have serious symptoms. This is called alcohol withdrawal syndrome.  Delirium tremens (DTs) is a group of life-threatening symptoms. You should get help right away if you have these symptoms.  Think about joining an alcohol support group or a treatment program. This information is not intended to replace advice given to you by your health care provider. Make sure you discuss any questions you have with your health care provider. Document Revised: 10/15/2017 Document Reviewed: 07/09/2017 Elsevier Patient Education  2020 [employer]  

## 2020-05-29 NOTE — Discharge Summary (Addendum)
Physician Discharge Summary  DEVARIOUS PAVEK RJJ:[id number] DOB: 1981/06/18 DOA: 05/26/2020  PCP: Marden Noble, MD  Admit date: 05/26/2020 Discharge date: 05/29/2020  Admitted From: Home Disposition: Home  Recommendations for Outpatient Follow-up:  1. Follow up with PCP in 1-2 weeks 2. Please obtain BMP/CBC in one week 3. Please follow up with your PCP on the following pending results: Unresulted Labs (From admission, onward) Comment          Start     Ordered   05/28/20 0500  CBC with Differential/Platelet  Daily,   R     Question:  Specimen collection method  Answer:  Lab=Lab collect   05/27/20 1253           Home Health: None Equipment/Devices: None  Discharge Condition: Stable CODE STATUS: Full code Diet recommendation: Cardiac  Subjective: Seen and examined.  Wife at the bedside.  Feels much better.  Denies any symptoms.  Ready to go home.  Brief/Interim Summary: Janyth Pupa a 39 y.o.malewith medical history significant ofalcohol abuse with associated neuropathy, anxiety, and tobacco use presented after being brought in by his wife for confusion.Patient has just recently been hospitalized from 7/9-7/10, after fallingandhittingthe back ofhishead while at work. Coworkers had witnessed him havinga tonic-clonic seizure lasting approximately4 minutes from review of records. He had been admitted to [facility] service for observation overnight. However, left the hospital AGAINST MEDICAL ADVICE 2 AM. Since being at home his wife reports that he has been talking and acting strangely and was confused.Normally he patient drinks abottle of wine a day on average. His last drink was around 05/20/2020  Upon arrival to ED, patient was seen to be afebrile pulse 102-116, respiration 18-28, blood pressure elevated up to 180/103, and O2 saturations transiently as low as 88% after receiving. Noted to be diaphoretic andconfused with unsteady gait. Labs significant for WBC 11.8,  hemoglobin 12.8, platelets 48, sodium 134, potassium 3.3, AST 207, ALT 120, andtotal bilirubin 1.3. Patient had been started on CIWA protocol with Ativan, thiamine, and ultimately given 20 mg of Geodon for agitation.  He was admitted under hospital service.  He was treated with as needed Ativan based on the CIWA protocol.  He was given multivitamins.  His platelets improved.  No signs of bleeding.  His LFTs were elevated due to alcohol abuse as well.  He was having intermittent diastolic hypertension and some tachycardia.  He was then started on Lopressor 25 mg p.o. twice daily which improved his tachycardia but still had elevated diastolic blood pressure so he was started on amlodipine 5 mg.  Patient is feeling much better now.  His CIWA has been less than 4 for last 36 hours and thus he has not required any as needed Ativan either.  He wants to go home and he is stable so he will be discharged today.  He is being discharged on new medications of Lopressor and amlodipine.  He is strongly encouraged to abstain from alcohol.  Follow-up with PCP for further management of hypertension.  He is on Lexapro for last 4 years but still has some anxiety.  Would recommend either increasing the dose or switching to different medication and will defer to PCP for that.  Of note, due to history of witnessed seizure, EEG was obtained which was unremarkable for any seizure activity.  His seizure was likely alcohol withdrawal seizure.  Discharge Diagnoses:  Active Problems:   Neuropathy   Alcohol withdrawal delirium (HCC)   Anxiety   Thrombocytopenia (HCC)  History of seizure   Alcohol abuse   Essential hypertension    Discharge Instructions   Allergies as of 05/29/2020      Reactions   Penicillins    Hives.       Medication List    STOP taking these medications   LORazepam 0.5 MG tablet Commonly known as: ATIVAN   NON FORMULARY   pantoprazole 40 MG tablet Commonly known as: PROTONIX     TAKE  these medications   ALPRAZolam 0.5 MG tablet Commonly known as: XANAX Take 0.5 mg by mouth at bedtime as needed for anxiety.   amLODipine 5 MG tablet Commonly known as: NORVASC Take 1 tablet (5 mg total) by mouth daily. Start taking on: May 30, 2020   escitalopram 10 MG tablet Commonly known as: LEXAPRO Take 10 mg by mouth daily.   gabapentin 300 MG capsule Commonly known as: NEURONTIN Take 1 capsule (300 mg total) by mouth 2 (two) times daily. What changed: when to take this   Humira 10 MG/0.1ML Pskt Generic drug: Adalimumab Inject into the skin every 14 (fourteen) days.   metoprolol tartrate 25 MG tablet Commonly known as: LOPRESSOR Take 1 tablet (25 mg total) by mouth 2 (two) times daily.       Follow-up Information    Marden Noble, MD Follow up in 1 week(s).   Specialty: Internal Medicine Contact information: [address] [place] [address] [phone]              Allergies  Allergen Reactions  . Penicillins     Hives.     Consultations: None   Procedures/Studies: CT HEAD WO CONTRAST  Result Date: 05/26/2020 CLINICAL DATA:  Seizure EXAM: CT HEAD WITHOUT CONTRAST TECHNIQUE: Contiguous axial images were obtained from the base of the skull through the vertex without intravenous contrast. COMPARISON:  May 24, 2020 FINDINGS: Brain: The ventricles and sulci are normal in size and configuration. There is no intracranial mass, hemorrhage, extra-axial fluid collection, or midline shift. The brain parenchyma appears unremarkable. No acute infarct evident. Vascular: No hyperdense vessel.  No evident vascular calcification. Skull: Bony calvarium appears intact. There is again noted parietal scalp hematoma superiorly and posteriorly on both left and right sides. Sinuses/Orbits: There is mucosal thickening in several ethmoid air cells. Other visualized paranasal sinuses are clear. There is rightward deviation of the nasal septum. Orbits appear  symmetric bilaterally. Other: Mastoid air cells are clear. IMPRESSION: Brain parenchyma appears unremarkable.  No mass or hemorrhage. Posterior parietal scalp hematoma again noted. Mucosal thickening in several ethmoid air cells noted. Deviated nasal septum. Electronically Signed   By: Bretta Bang III M.D.   On: 05/26/2020 12:57   EEG adult  Result Date: 05/27/2020 Charlsie Quest, MD     05/27/2020  5:43 PM Patient Name: SCHYLAR WUEBKER MRN: [id number] Epilepsy Attending: Charlsie Quest Referring Physician/Provider: Dr Hughie Closs Date: 07/28/2020 Duration: 23.37 mins Patient history: 39yo M with alcohol use disorder who had seizure like episode. EEG to evaluate for seizure. Level of alertness: Awake AEDs during EEG study: Gabapentin, Lorazepam Technical aspects: This EEG study was done with scalp electrodes positioned according to the 10-20 International system of electrode placement. Electrical activity was acquired at a sampling rate of 500Hz  and reviewed with a high frequency filter of 70Hz  and a low frequency filter of 1Hz . EEG data were recorded continuously and digitally stored. Description: The posterior dominant rhythm consists of 9 Hz activity of moderate voltage (25-35  uV) seen predominantly in posterior head regions, symmetric and reactive to eye opening and eye closing. Physiology photic driving was seen during photic stimulation.  Hyperventilation was not performed.   IMPRESSION: This study is within normal limits. No seizures or epileptiform discharges were seen throughout the recording. Charlsie Quest      Discharge Exam: Vitals:   05/29/20 0445 05/29/20 0901  BP: (!) 136/96 (!) 129/96  Pulse: 96 92  Resp:  19  Temp: 98 F (36.7 C) 98.5 F (36.9 C)  SpO2: 98%    Vitals:   05/28/20 2300 05/29/20 0300 05/29/20 0445 05/29/20 0901  BP:   (!) 136/96 (!) 129/96  Pulse:   96 92  Resp: 19 16  19   Temp:   98 F (36.7 C) 98.5 F (36.9 C)  TempSrc:   Oral Oral  SpO2:    98%   Weight:      Height:        General: Pt is alert, awake, not in acute distress Cardiovascular: RRR, S1/S2 +, no rubs, no gallops Respiratory: CTA bilaterally, no wheezing, no rhonchi Abdominal: Soft, NT, ND, bowel sounds + Extremities: no edema, no cyanosis    The results of significant diagnostics from this hospitalization (including imaging, microbiology, ancillary and laboratory) are listed below for reference.     Microbiology: Recent Results (from the past 240 hour(s))  SARS Coronavirus 2 by RT PCR (hospital order, performed in [facility] hospital lab) Nasopharyngeal Nasopharyngeal Swab     Status: None   Collection Time: 05/26/20  1:58 PM   Specimen: Nasopharyngeal Swab  Result Value Ref Range Status   SARS Coronavirus 2 NEGATIVE NEGATIVE Final    Comment: (NOTE) SARS-CoV-2 target nucleic acids are NOT DETECTED.  The SARS-CoV-2 RNA is generally detectable in upper and lower respiratory specimens during the acute phase of infection. The lowest concentration of SARS-CoV-2 viral copies this assay can detect is 250 copies / mL. A negative result does not preclude SARS-CoV-2 infection and should not be used as the sole basis for treatment or other patient management decisions.  A negative result may occur with improper specimen collection / handling, submission of specimen other than nasopharyngeal swab, presence of viral mutation(s) within the areas targeted by this assay, and inadequate number of viral copies (<250 copies / mL). A negative result must be combined with clinical observations, patient history, and epidemiological information.  Fact Sheet for Patients:   07/27/20  Fact Sheet for Healthcare Providers: BoilerBrush.com.cy  This test is not yet approved or  cleared by the https://pope.com/ FDA and has been authorized for detection and/or diagnosis of SARS-CoV-2 by FDA under an Emergency Use  Authorization (EUA).  This EUA will remain in effect (meaning this test can be used) for the duration of the COVID-19 declaration under Section 564(b)(1) of the Act, 21 U.S.C. section 360bbb-3(b)(1), unless the authorization is terminated or revoked sooner.  Performed at [facility], [address], [place], [place]   MRSA PCR Screening     Status: None   Collection Time: 05/26/20  5:04 PM   Specimen: Nasal Mucosa; Nasopharyngeal  Result Value Ref Range Status   MRSA by PCR NEGATIVE NEGATIVE Final    Comment:        The GeneXpert MRSA Assay (FDA approved for NASAL specimens only), is one component of a comprehensive MRSA colonization surveillance program. It is not intended to diagnose MRSA infection nor to guide or monitor treatment for MRSA infections.  Performed at [facility], [address] [place], [place] [id number]      Labs: BNP (last 3 results) No results for input(s): BNP in the last 8760 hours. Basic Metabolic Panel: Recent Labs  Lab 05/26/20 0839 05/27/20 0218 05/28/20 0226 05/29/20 0332  NA 134* 138 138 137  K 3.3* 3.1* 3.8 3.5  CL 97* 102 100 100  CO2 24 24 25 25   GLUCOSE 112* 99 95 103*  BUN 9 9 8 10   CREATININE 0.69 0.75 0.77 0.70  CALCIUM 9.6 9.4 9.7 9.6  MG 1.7  --  1.7  --   PHOS 2.4*  --   --   --    Liver Function Tests: Recent Labs  Lab 05/26/20 0839 05/28/20 0226 05/29/20 0332  AST 207* 111* 75*  ALT 120* 95* 87*  ALKPHOS 47 45 45  BILITOT 1.3* 1.2 0.9  PROT 6.9 6.6 7.0  ALBUMIN 4.2 3.9 3.8   No results for input(s): LIPASE, AMYLASE in the last 168 hours. No results for input(s): AMMONIA in the last 168 hours. CBC: Recent Labs  Lab 05/26/20 0839 05/27/20 0218 05/28/20 0226 05/29/20 0332  WBC 11.8* 7.1 7.5 6.7  NEUTROABS 10.0*  --  4.6 4.2  HGB 12.8* 12.6* 13.5 14.0  HCT 38.8* 37.7* 40.2 41.1  MCV 94.9 95.0 93.3 93.4  PLT 48* 50* 68* 103*   Cardiac Enzymes: No results for input(s): CKTOTAL,  CKMB, CKMBINDEX, TROPONINI in the last 168 hours. BNP: Invalid input(s): POCBNP CBG: No results for input(s): GLUCAP in the last 168 hours. D-Dimer No results for input(s): DDIMER in the last 72 hours. Hgb A1c No results for input(s): HGBA1C in the last 72 hours. Lipid Profile No results for input(s): CHOL, HDL, LDLCALC, TRIG, CHOLHDL, LDLDIRECT in the last 72 hours. Thyroid function studies No results for input(s): TSH, T4TOTAL, T3FREE, THYROIDAB in the last 72 hours.  Invalid input(s): FREET3 Anemia work up No results for input(s): VITAMINB12, FOLATE, FERRITIN, TIBC, IRON, RETICCTPCT in the last 72 hours. Urinalysis    Component Value Date/Time   COLORURINE YELLOW 05/26/2020 1853   APPEARANCEUR CLEAR 05/26/2020 1853   LABSPEC 1.014 05/26/2020 1853   PHURINE 7.0 05/26/2020 1853   GLUCOSEU NEGATIVE 05/26/2020 1853   HGBUR SMALL (A) 05/26/2020 1853   BILIRUBINUR NEGATIVE 05/26/2020 1853   KETONESUR 20 (A) 05/26/2020 1853   PROTEINUR 100 (A) 05/26/2020 1853   NITRITE NEGATIVE 05/26/2020 1853   LEUKOCYTESUR NEGATIVE 05/26/2020 1853   Sepsis Labs Invalid input(s): PROCALCITONIN,  WBC,  LACTICIDVEN Microbiology Recent Results (from the past 240 hour(s))  SARS Coronavirus 2 by RT PCR (hospital order, performed in [facility] hospital lab) Nasopharyngeal Nasopharyngeal Swab     Status: None   Collection Time: 05/26/20  1:58 PM   Specimen: Nasopharyngeal Swab  Result Value Ref Range Status   SARS Coronavirus 2 NEGATIVE NEGATIVE Final    Comment: (NOTE) SARS-CoV-2 target nucleic acids are NOT DETECTED.  The SARS-CoV-2 RNA is generally detectable in upper and lower respiratory specimens during the acute phase of infection. The lowest concentration of SARS-CoV-2 viral copies this assay can detect is 250 copies / mL. A negative result does not preclude SARS-CoV-2 infection and should not be used as the sole basis for treatment or other patient management decisions.  A negative  result may occur with improper specimen collection / handling, submission of specimen other than nasopharyngeal swab, presence of viral mutation(s) within the areas targeted by this assay, and inadequate number of  viral copies (<250 copies / mL). A negative result must be combined with clinical observations, patient history, and epidemiological information.  Fact Sheet for Patients:   BoilerBrush.com.cy  Fact Sheet for Healthcare Providers: https://pope.com/  This test is not yet approved or  cleared by the [place] FDA and has been authorized for detection and/or diagnosis of SARS-CoV-2 by FDA under an Emergency Use Authorization (EUA).  This EUA will remain in effect (meaning this test can be used) for the duration of the COVID-19 declaration under Section 564(b)(1) of the Act, 21 U.S.C. section 360bbb-3(b)(1), unless the authorization is terminated or revoked sooner.  Performed at [facility], [address] [place], [place] [address]   MRSA PCR Screening     Status: None   Collection Time: 05/26/20  5:04 PM   Specimen: Nasal Mucosa; Nasopharyngeal  Result Value Ref Range Status   MRSA by PCR NEGATIVE NEGATIVE Final    Comment:        The GeneXpert MRSA Assay (FDA approved for NASAL specimens only), is one component of a comprehensive MRSA colonization surveillance program. It is not intended to diagnose MRSA infection nor to guide or monitor treatment for MRSA infections. Performed at [facility], [address], [place], [place] [address]      Time coordinating discharge: Over 30 minutes  SIGNED:   Hughie Closs, MD  [facility] 05/29/2020, 9:35 AM  If 7PM-7AM, please contact night-coverage [email or web address]

## 2020-09-13 ENCOUNTER — Ambulatory Visit: Payer: No Typology Code available for payment source | Admitting: Neurology

## 2020-10-31 ENCOUNTER — Encounter: Payer: Self-pay | Admitting: Neurology

## 2020-10-31 ENCOUNTER — Other Ambulatory Visit: Payer: Self-pay

## 2020-10-31 ENCOUNTER — Ambulatory Visit (INDEPENDENT_AMBULATORY_CARE_PROVIDER_SITE_OTHER): Payer: Commercial Managed Care - PPO | Admitting: Neurology

## 2020-10-31 VITALS — BP 112/76 | HR 79 | Ht 74.0 in | Wt 196.0 lb

## 2020-10-31 DIAGNOSIS — G621 Alcoholic polyneuropathy: Secondary | ICD-10-CM

## 2020-10-31 MED ORDER — GABAPENTIN 300 MG PO CAPS: ORAL_CAPSULE | ORAL | 3 refills | Status: DC

## 2020-10-31 NOTE — Patient Instructions (Signed)
Increase gabapentin to 2 tablets at bedtime, continue 1 tablet in the morning  Return to clinic in 6 months

## 2020-10-31 NOTE — Progress Notes (Signed)
Follow-up Visit   Date: 05/04/2019   Joshua Wade MRN: [id number] DOB: 1980/11/18   Interim History: Joshua Wade is a 39 y.o. right-handed Caucasian male with history of alcohol dependency (sober since July 2021) returning to the clinic for follow-up of neuropathy.  The patient was accompanied to the clinic by self.  He was hospitalized in July for alcohol withdrawal manifesting with a seizure and altered mental status.  Patient went into alcohol detox program at [facility] and has been sober since July. He feels significantly better since stopping alcohol and mood is also improved.  He reports no worsening tingling in the feet, but no significant improvement either.  Somedays are worse than others and he needs to take a break and rub his feet to get any relief. He takes gabapentin 327m twice daily.  Balance and strength is good.     Medications:  Current Outpatient Medications on File Prior to Visit  Medication Sig Dispense Refill  . Adalimumab (HUMIRA) 10 MG/0.1ML PSKT Inject into the skin every 14 (fourteen) days.    . ALPRAZolam (XANAX) 0.5 MG tablet Take 0.5 mg by mouth at bedtime as needed for anxiety.    .Marland Kitchenescitalopram (LEXAPRO) 10 MG tablet Take 10 mg by mouth daily.    .Marland Kitchenescitalopram (LEXAPRO) 10 MG tablet Take 5 mg by mouth daily.     .Marland Kitchengabapentin (NEURONTIN) 300 MG capsule Take 1 capsule (300 mg total) by mouth 2 (two) times daily. (Patient taking differently: Take 300 mg by mouth daily.) 90 capsule 3  . HUMIRA PEN 40 MG/0.4ML PNKT Inject 40 mg into the skin every 14 (fourteen) days.    .Marland KitchenLORazepam (ATIVAN) 1 MG tablet Take 1 mg by mouth as needed for anxiety.     . ALPRAZolam (XANAX) 0.5 MG tablet Take 0.5 mg by mouth as needed for anxiety.  (Patient not taking: Reported on 10/31/2020)    . amLODipine (NORVASC) 5 MG tablet Take 1 tablet (5 mg total) by mouth daily. 30 tablet 0  . gabapentin (NEURONTIN) 300 MG capsule Take 300 mg by mouth daily.  (Patient  not taking: Reported on 10/31/2020)    . metoprolol tartrate (LOPRESSOR) 25 MG tablet Take 1 tablet (25 mg total) by mouth 2 (two) times daily. 60 tablet 0   No current facility-administered medications on file prior to visit.    Allergies:  Allergies  Allergen Reactions  . Penicillins     Hives.   . Penicillins     Childhood     Vital Signs:  BP 112/76   Pulse 79   Ht 6' 2"  (1.88 m)   Wt 196 lb (88.9 kg)   SpO2 99%   BMI 25.16 kg/m   Neurological Exam: MENTAL STATUS including orientation to time, place, person, recent and remote memory, attention span and concentration, language, and fund of knowledge is normal.  Speech is not dysarthric.  CRANIAL NERVES:    Extraocular muscles intact  MOTOR:  Motor strength is 5/5 in all extremities, including distally with toe flexion and extension. o atrophy, fasciculations or abnormal movements.  No pronator drift.  Tone is normal.    MSRs:                                           Right        Left brachioradialis 2+  2+  biceps 2+  2+  triceps 2+  2+  patellar 2+  2+  ankle jerk* improved 2+  2+  Hoffman no  no  plantar response down  down    SENSORY:  Reduced vibration at the great toe, intact at the ankles.  Pin prick and temperature reduced distally in the feet.   COORDINATION/GAIT:  Normal finger-to- nose-finger.  Intact rapid alternating movements bilaterally.  Gait narrow based and stable.  Stressed and tandem gait intact.   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  Overall, these findings are mild in degree electrically.  Labs 03/30/2019: AST 156, ALT 247, total bilirubin 1.4, lipase 174, vitamin B12 403, TSH 1.87  Labs 04/13/2019: AST 22, ALT 24, lipase 82, total bilirubin 0.3  Labs 05/08/2019:  ESR 2, CRP 0.2, MMA 108, vitamin B1 10, folate 17.2, SPEP with IFE no M protein,  copper 98.    IMPRESSION/PLAN: Alcohol-induced peripheral neuropathy, mostly stable with intermittent exacerbation of pain  - Increase gabapentin to 613m at bedtime and continue 3056min the morning  - Praised patient for abstaining from alcohol and encouraged him to continue to refrain from it  - Continue multivitamin daily  Return to clinic in 6 months  Thank you for allowing me to participate in patient's care.  If I can answer any additional questions, I would be pleased to do so.    Sincerely,    Chrisopher Pustejovsky K. PaPosey ProntoDO

## 2021-03-07 ENCOUNTER — Ambulatory Visit
Admission: RE | Admit: 2021-03-07 | Discharge: 2021-03-07 | Disposition: A | Discharge status: Home / Self Care | Payer: Commercial Managed Care - PPO | Source: Ambulatory Visit | Attending: Internal Medicine | Admitting: Internal Medicine

## 2021-03-07 ENCOUNTER — Other Ambulatory Visit: Payer: Self-pay | Admitting: Internal Medicine

## 2021-03-07 DIAGNOSIS — R0789 Other chest pain: Secondary | ICD-10-CM

## 2021-03-18 ENCOUNTER — Other Ambulatory Visit: Payer: Self-pay | Admitting: Internal Medicine

## 2021-03-18 DIAGNOSIS — S2239XA Fracture of one rib, unspecified side, initial encounter for closed fracture: Secondary | ICD-10-CM

## 2021-03-20 ENCOUNTER — Other Ambulatory Visit: Payer: Self-pay

## 2021-03-20 ENCOUNTER — Ambulatory Visit
Admission: RE | Admit: 2021-03-20 | Discharge: 2021-03-20 | Disposition: A | Discharge status: Home / Self Care | Payer: Commercial Managed Care - PPO | Source: Ambulatory Visit | Attending: Internal Medicine | Admitting: Internal Medicine

## 2021-03-20 DIAGNOSIS — S2239XA Fracture of one rib, unspecified side, initial encounter for closed fracture: Secondary | ICD-10-CM

## 2021-04-21 ENCOUNTER — Ambulatory Visit: Payer: Commercial Managed Care - PPO | Admitting: Neurology

## 2021-05-01 ENCOUNTER — Ambulatory Visit: Payer: Commercial Managed Care - PPO | Admitting: Neurology

## 2021-06-02 ENCOUNTER — Ambulatory Visit: Payer: Commercial Managed Care - PPO | Admitting: Neurology

## 2021-06-02 ENCOUNTER — Other Ambulatory Visit: Payer: Self-pay

## 2021-06-02 ENCOUNTER — Encounter: Payer: Self-pay | Admitting: Neurology

## 2021-06-02 VITALS — BP 121/80 | HR 82 | Ht 74.0 in | Wt 189.2 lb

## 2021-06-02 DIAGNOSIS — G629 Polyneuropathy, unspecified: Secondary | ICD-10-CM | POA: Diagnosis not present

## 2021-06-02 MED ORDER — GABAPENTIN 300 MG PO CAPS: ORAL_CAPSULE | ORAL | 1 refills | Status: DC

## 2021-06-02 NOTE — Progress Notes (Signed)
Follow-up Visit   Date: 05/04/2019   Joshua GONGAWARE MRN: [id number] DOB: 15-Feb-1981   Interim History: Joshua Wade is a 40 y.o. right-handed Caucasian male with history of alcohol dependency (sober since July 2021) returning to the clinic for follow-up of neuropathy.  The patient was accompanied to the clinic by self.  He is here for follow-up visit.  His neuropathy is relatively unchanged.  He continues to have burning and stinging pain when he is on his feet a lot.  The numbness is constant in the feet. Balance is much better.  He takes gabapentin 322m in the morning and 6058mat bedtime, and does not recall that the nighttime increased helped significantly.  He denies any medication side effects.   He remains sober since July 2022.   Medications:  Current Outpatient Medications on File Prior to Visit  Medication Sig Dispense Refill   Adalimumab (HUMIRA) 10 MG/0.1ML PSKT Inject into the skin every 14 (fourteen) days.     ALPRAZolam (XANAX) 0.5 MG tablet Take 0.5 mg by mouth at bedtime as needed for anxiety. (Patient not taking: Reported on 10/31/2020)     amLODipine (NORVASC) 5 MG tablet Take 1 tablet (5 mg total) by mouth daily. 30 tablet 0   escitalopram (LEXAPRO) 10 MG tablet Take 10 mg by mouth daily.     gabapentin (NEURONTIN) 300 MG capsule Take 1 tablet in the morning and 2 tablets at bedtime. 270 capsule 3   HUMIRA PEN 40 MG/0.4ML PNKT Inject 40 mg into the skin every 14 (fourteen) days.     LORazepam (ATIVAN) 1 MG tablet Take 1 mg by mouth as needed for anxiety.      metoprolol tartrate (LOPRESSOR) 25 MG tablet Take 1 tablet (25 mg total) by mouth 2 (two) times daily. 60 tablet 0   naltrexone (DEPADE) 50 MG tablet Take 50 mg by mouth daily.     No current facility-administered medications on file prior to visit.    Allergies:  Allergies  Allergen Reactions   Penicillins     Hives.    Penicillins     Childhood     Vital Signs:  There were no vitals  taken for this visit.  Neurological Exam: MENTAL STATUS including orientation to time, place, person, recent and remote memory, attention span and concentration, language, and fund of knowledge is normal.  Speech is not dysarthric.  CRANIAL NERVES:    Extraocular muscles intact  MOTOR:  Motor strength is 5/5 in all extremities, including distally with toe flexion and extension. o atrophy, fasciculations or abnormal movements.  No pronator drift.  Tone is normal.    MSRs:                                           Right        Left brachioradialis 2+  2+  biceps 2+  2+  triceps 2+  2+  patellar 2+  2+  ankle jerk* improved 2+  2+  Hoffman no  no  plantar response down  down    SENSORY:  Reduced vibration at the great toe, intact at the ankles.  Temperature reduced distally in the feet.   COORDINATION/GAIT:  Normal finger-to- nose-finger.    Gait narrow based and stable.  Stressed and tandem gait intact.   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  Overall, these findings are mild in degree electrically.  Labs 03/30/2019: AST 156, ALT 247, total bilirubin 1.4, lipase 174, vitamin B12 403, TSH 1.87   Labs 04/13/2019: AST 22, ALT 24, lipase 82, total bilirubin 0.3  Labs 05/08/2019:  ESR 2, CRP 0.2, MMA 108, vitamin B1 10, folate 17.2, SPEP with IFE no M protein, copper 98.    IMPRESSION/PLAN: Alcohol-induced peripheral neuropathy, mostly stable with intermittent exacerbation of pain  - Increase gabapentin to 64m twice daily.  If he develops daytime sleepiness, increase nighttime dose to 9092mat bedtime  - Consider Cymbalta going forward - Praised for making healthy lifestyle changes and remaining sobert  Return to clinic in 6 months  Thank you for allowing me to participate in patient's care.  If I can answer any additional questions, I  would be pleased to do so.    Sincerely,    Kedar Sedano K. PaPosey ProntoDO

## 2021-06-02 NOTE — Patient Instructions (Signed)
Increase gabapentin 600mg  twice daily  Return to clinic 6 months

## 2021-06-16 IMAGING — CT CT HEAD W/O CM
4 series · 16 of 47 positions shown, 18 images · non-contrast
Comparison: May 24, 2020

CLINICAL DATA: Seizure

EXAM:
CT HEAD WITHOUT CONTRAST
TECHNIQUE: Contiguous axial images were obtained from the base of the skull
through the vertex without intravenous contrast.

[Series 3: head without · axial · non-contrast · 0.43mm/px · z∈[-145,-25]mm · 7 of 33 slices shown, 9 images]
[im 5/33  brain]
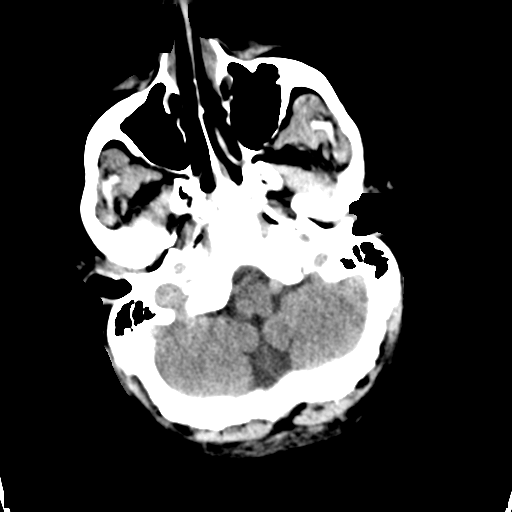
[im 5/33  bone]
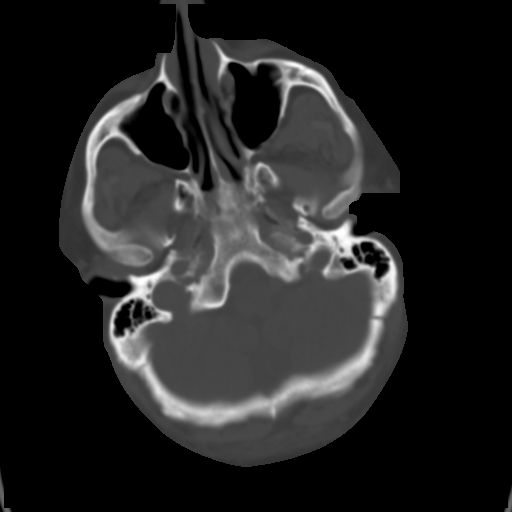
[im 9/33  brain]
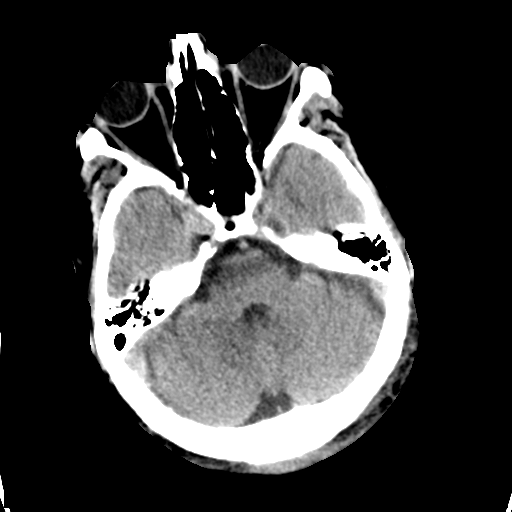
[im 13/33  brain]
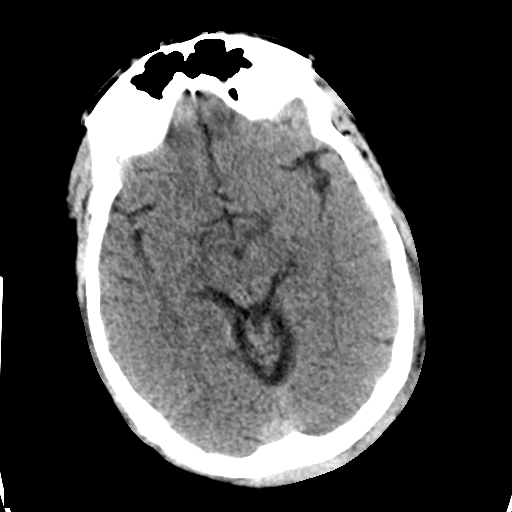
[im 17/33  brain]
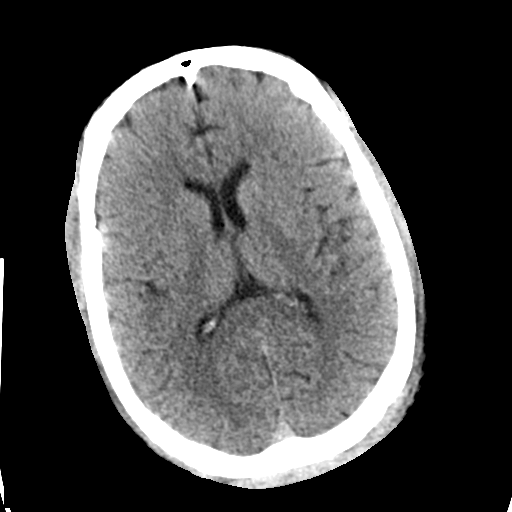
[im 21/33  brain]
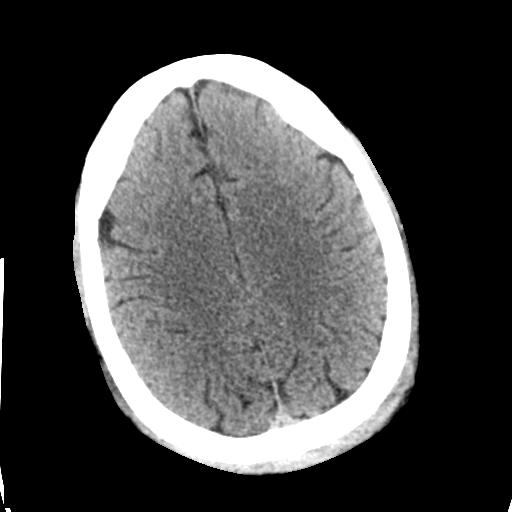
[im 21/33  bone]
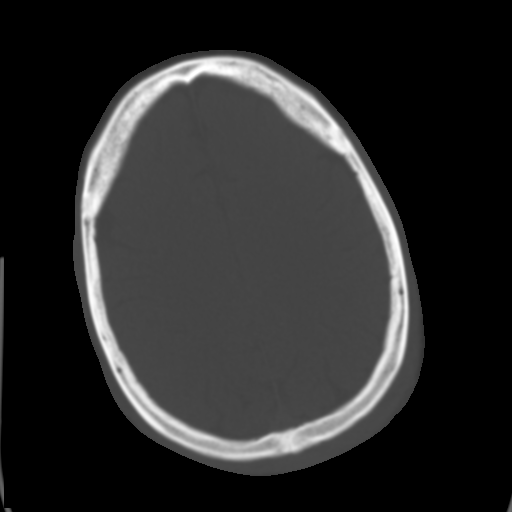
[im 25/33  brain]
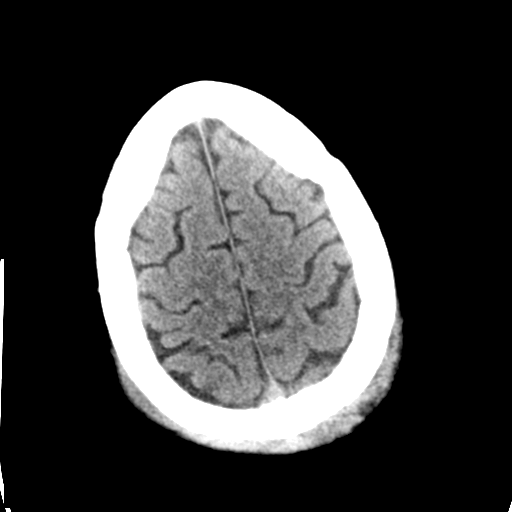
[im 29/33  brain]
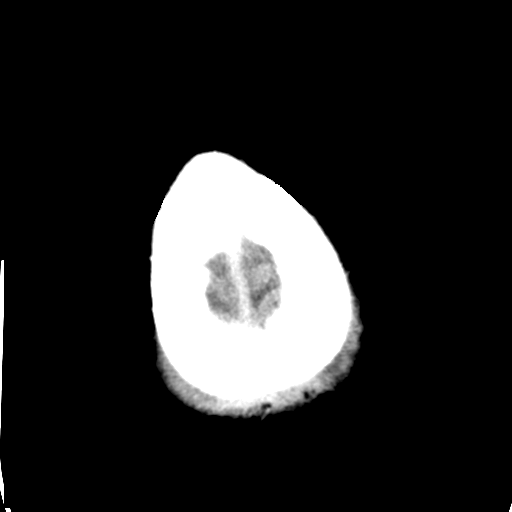

[Series 4: head bone · axial · 0.43mm/px · z∈[-149,-117]mm · 3 of 82 slices shown]
[im 9/82  bone]
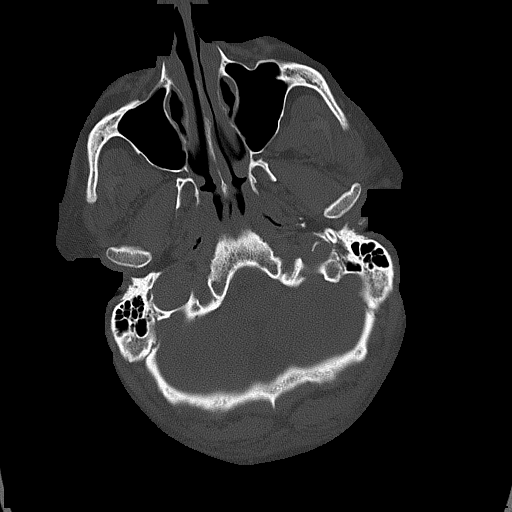
[im 17/82  bone]
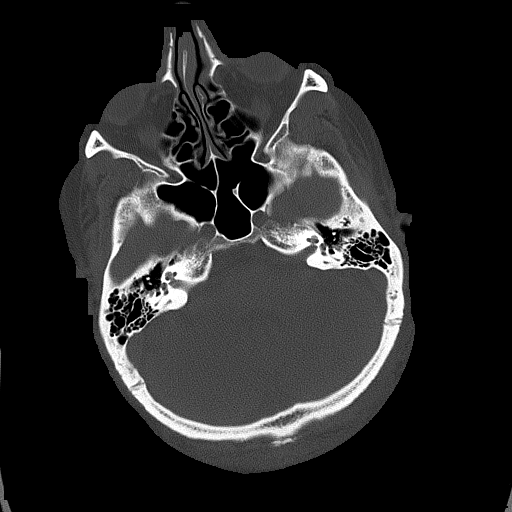
[im 25/82  bone]
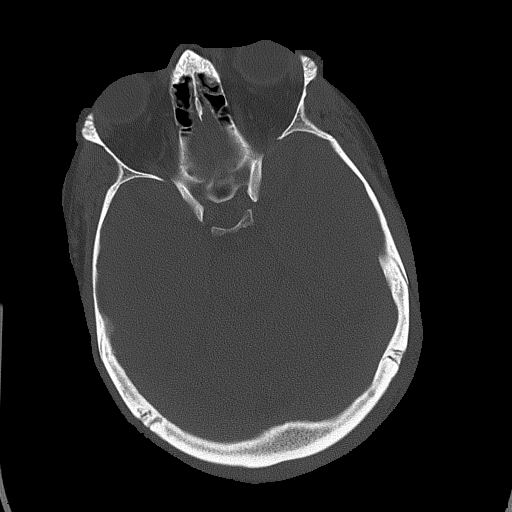

[Series 5: head without cor · coronal · non-contrast · 0.32mm/px · 3 of 67 slices shown]
[im 23/67  brain]
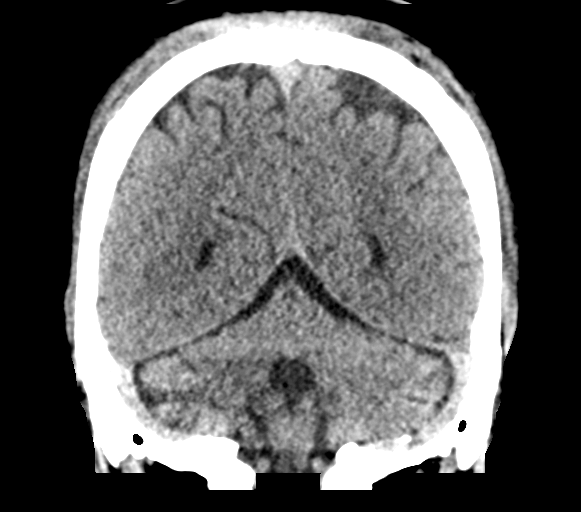
[im 30/67  brain]
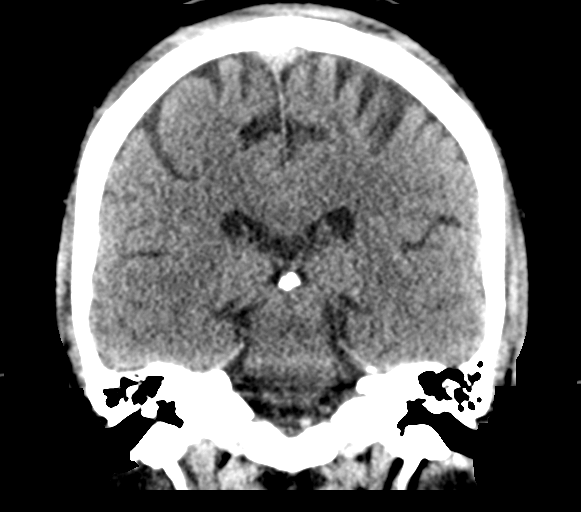
[im 37/67  brain]
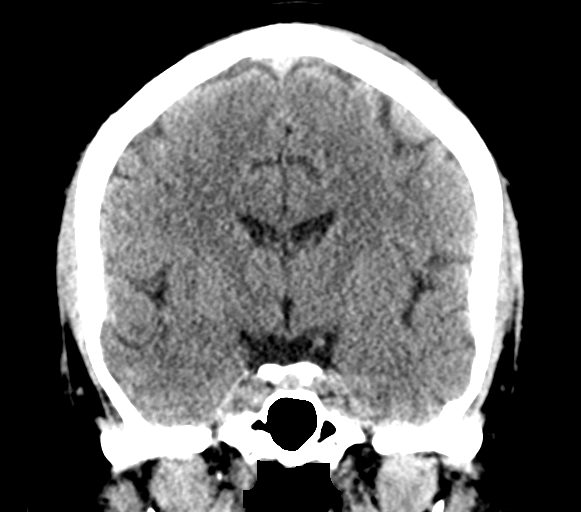

[Series 6: head without sag · sagittal · non-contrast · 0.32mm/px · 3 of 56 slices shown]
[im 19/56  brain]
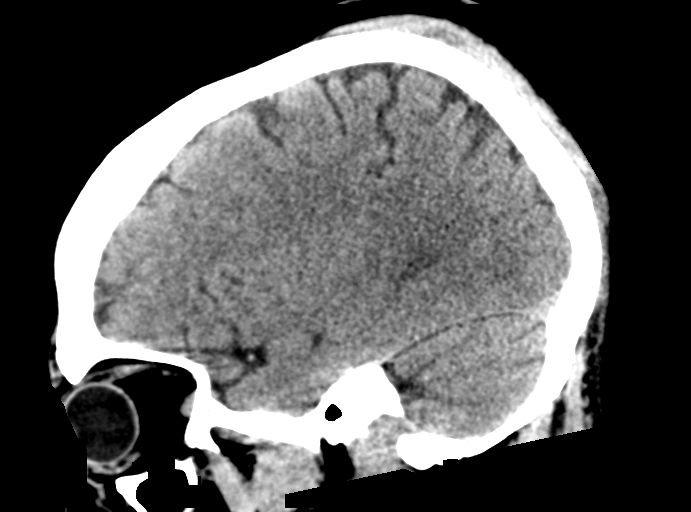
[im 28/56  brain]
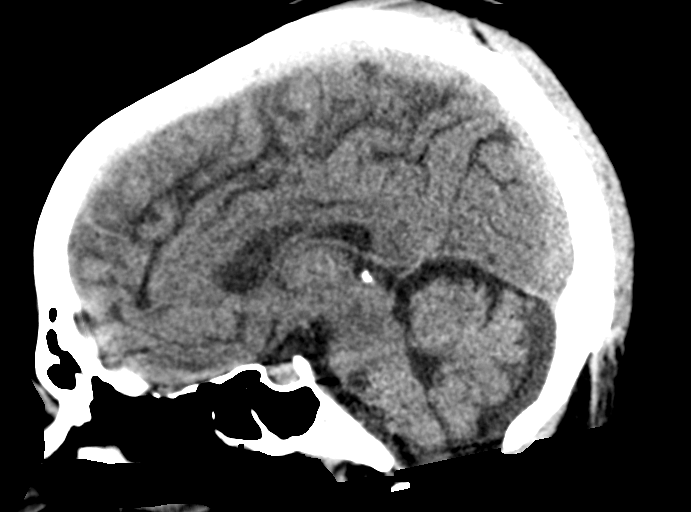
[im 37/56  brain]
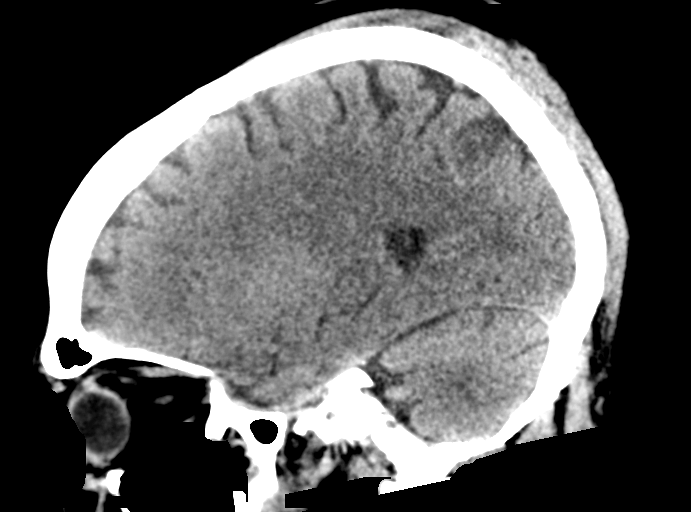

[16 of 47 positions shown; findings below may reference images not displayed]

FINDINGS: Brain: The ventricles and sulci are normal in size and
configuration. There is no intracranial mass, hemorrhage,
extra-axial fluid collection, or midline shift. The brain parenchyma
appears unremarkable. No acute infarct evident.

Vascular: No hyperdense vessel.  No evident vascular calcification.

Skull: Bony calvarium appears intact. There is again noted parietal
scalp hematoma superiorly and posteriorly on both left and right
sides.

Sinuses/Orbits: There is mucosal thickening in several ethmoid air
cells. Other visualized paranasal sinuses are clear. There is
rightward deviation of the nasal septum. Orbits appear symmetric
bilaterally.

Other: Mastoid air cells are clear.
IMPRESSION: Brain parenchyma appears unremarkable.  No mass or hemorrhage.

Posterior parietal scalp hematoma again noted.

Mucosal thickening in several ethmoid air cells noted. Deviated
nasal septum.

## 2021-09-29 ENCOUNTER — Other Ambulatory Visit: Payer: Self-pay

## 2021-09-29 ENCOUNTER — Telehealth: Payer: Self-pay | Admitting: Neurology

## 2021-09-29 MED ORDER — GABAPENTIN 300 MG PO CAPS: ORAL_CAPSULE | ORAL | 2 refills | Status: DC

## 2021-09-29 NOTE — Telephone Encounter (Signed)
Pt called in stating his prescription for the gabapentin was changed and now the pharmacy won't refill his prescription.

## 2021-09-29 NOTE — Telephone Encounter (Signed)
Script sent to Dr Allena Katz to review and send to pharmacy

## 2021-12-03 ENCOUNTER — Ambulatory Visit: Payer: Commercial Managed Care - PPO | Admitting: Neurology

## 2021-12-29 ENCOUNTER — Other Ambulatory Visit: Payer: Self-pay | Admitting: Neurology

## 2023-02-28 ENCOUNTER — Other Ambulatory Visit: Payer: Self-pay | Admitting: Neurology

## 2023-03-01 ENCOUNTER — Telehealth: Payer: Self-pay | Admitting: Anesthesiology

## 2023-03-01 NOTE — Telephone Encounter (Signed)
Patient called requesting a refill on his medication  1. Which medications need refilled? (List name and dosage, if known) Gabapentin 300 mg   2. Which pharmacy/location is medication to be sent to? [facility] on [address], [place]  3. Do they need a 30 day or 90 day supply? 90 day supply

## 2023-03-16 ENCOUNTER — Ambulatory Visit: Payer: Commercial Managed Care - PPO | Admitting: Neurology

## 2023-03-31 ENCOUNTER — Ambulatory Visit: Payer: Commercial Managed Care - PPO | Admitting: Neurology

## 2023-03-31 ENCOUNTER — Encounter: Payer: Self-pay | Admitting: Neurology

## 2023-03-31 DIAGNOSIS — Z029 Encounter for administrative examinations, unspecified: Secondary | ICD-10-CM
